# Patient Record
Sex: Male | Born: 1965 | Race: Black or African American | Hispanic: No | Marital: Married | Smoking: Never smoker
Health system: Southern US, Community
[De-identification: ages and names within clinical notes are randomized; demographics above are authoritative.]

## PROBLEM LIST (undated history)

## (undated) DIAGNOSIS — I119 Hypertensive heart disease without heart failure: Secondary | ICD-10-CM

## (undated) DIAGNOSIS — I2510 Atherosclerotic heart disease of native coronary artery without angina pectoris: Secondary | ICD-10-CM

## (undated) DIAGNOSIS — I10 Essential (primary) hypertension: Secondary | ICD-10-CM

## (undated) DIAGNOSIS — E119 Type 2 diabetes mellitus without complications: Secondary | ICD-10-CM

## (undated) DIAGNOSIS — N182 Chronic kidney disease, stage 2 (mild): Secondary | ICD-10-CM

## (undated) DIAGNOSIS — I252 Old myocardial infarction: Secondary | ICD-10-CM

## (undated) DIAGNOSIS — G4730 Sleep apnea, unspecified: Secondary | ICD-10-CM

## (undated) DIAGNOSIS — F419 Anxiety disorder, unspecified: Secondary | ICD-10-CM

## (undated) HISTORY — PX: OTHER SURGICAL HISTORY: SHX169

---

## 2007-08-23 ENCOUNTER — Ambulatory Visit (HOSPITAL_BASED_OUTPATIENT_CLINIC_OR_DEPARTMENT_OTHER): Admission: RE | Admit: 2007-08-23 | Discharge: 2007-08-23 | Payer: Self-pay | Admitting: Internal Medicine

## 2007-08-31 ENCOUNTER — Ambulatory Visit: Payer: Self-pay | Admitting: Internal Medicine

## 2007-10-04 ENCOUNTER — Ambulatory Visit (HOSPITAL_BASED_OUTPATIENT_CLINIC_OR_DEPARTMENT_OTHER): Admission: RE | Admit: 2007-10-04 | Discharge: 2007-10-04 | Payer: Self-pay | Admitting: Internal Medicine

## 2007-10-11 ENCOUNTER — Ambulatory Visit: Payer: Self-pay | Admitting: Internal Medicine

## 2008-08-11 ENCOUNTER — Ambulatory Visit: Payer: Self-pay | Admitting: Family Medicine

## 2008-08-11 ENCOUNTER — Inpatient Hospital Stay (HOSPITAL_COMMUNITY): Admission: EM | Admit: 2008-08-11 | Discharge: 2008-08-12 | Payer: Self-pay | Admitting: Emergency Medicine

## 2008-12-16 ENCOUNTER — Emergency Department (HOSPITAL_COMMUNITY): Admission: EM | Admit: 2008-12-16 | Discharge: 2008-12-16 | Payer: Self-pay | Admitting: Emergency Medicine

## 2011-01-16 NOTE — Procedures (Signed)
NAME:  MCCABE, GLORIA NO.:  [id number]   MEDICAL RECORD NO.:  [id number]          PATIENT TYPE:  OUT   LOCATION:  SLEEP CENTER                 FACILITY:  [facility]   PHYSICIAN:  Clinton D. Maple Hudson, MD, FCCP, FACPDATE OF BIRTH:  1966-10-23   DATE OF STUDY:  08/23/2007                            NOCTURNAL POLYSOMNOGRAM   REFERRING PHYSICIAN:  Fleet Contras, M.D.   INDICATIONS FOR PROCEDURE:  Insomnia with sleep apnea.   RESULTS:  Epward sleepiness score 16/24, BMI 44.6, weight 293 pounds,  height 68 inches, neck 18 inches.   HOME MEDICATIONS:  Charted and reviewed.   SLEEP ARCHITECTURE:  Total sleep time 170 minutes with sleep efficiency  48%. Stage 1 was 5%; Stage 2, 78%; Stage 3 absent; REM 16.8% of total  sleep time. Sleep latency 14 minutes, REM latency 67 minutes. Awake  after sleep onset, 4 minutes. Arousal index 2.8. Lights were out at 2257  p.m. with sleep onset at around 2315. The patient woke at about 2:15  a.m. and remained awake the rest of the night. He states this was  typical for him and referred to a history of post-traumatic stress  disorder. No bedtime medication was taken, although he has Ambien listed  in his home medications.   RESPIRATORY DATA:  Apnea hypopnea index (AHI) 33.2 obstructive events  per hour, indicating moderate obstructive sleep apnea/hypopnea syndrome.  This included 1 obstructive apnea and 93 hypopnea's. REM AHI was 72.9.  Most events occurred while sleeping supine. Diagnostic NP SG protocol  was ordered.   OXYGEN DATA:  Moderately loud snoring with oxygen desaturation to a  nadir of 73%. Mean oxygen saturation through the study was 91.7% on room  air. 21 minutes were recorded with an oxygen saturation of less than  88%.   CARDIAC DATA:  Normal sinus rhythm.   MOVEMENT/PARASOMNIA:  No significant limb movement disturbance. No  bathroom trips.   IMPRESSION/RECOMMENDATIONS:  1. The patient woke shortly after 2:00 a.m. and  was awake the rest of      the night. He indicated that this was a common pattern for him. It      would certainly contribute to the patient's complaint of daytime      sleepiness and discussion of sleep schedule, sleep comfort, and      sleep hygiene would seem appropriate. Note that he was not recorded      as taking Ambien on this study night, although he has it available      at home. He referred to PTSD.  2. Moderate obstructive sleep apnea/hypopnea syndrome, AHI 33.2 per      hour with most events while sleeping supine. Moderately loud      snoring with oxygen desaturation to a nadir of 73%, recording a      total of 21 minutes with oxygen saturation below 88%.  3. Consider return for CPAP titration, bringing a sleep aid medication      or evaluate for alternative therapies as appropriate.      Clinton D. Maple Hudson, MD, FCCP, FACP  Diplomate, Biomedical engineer of Sleep Medicine  Electronically Signed  CDY/MEDQ  D:  08/31/2007 10:40:53  T:  08/31/2007 21:56:42  Job:  [id number]

## 2011-01-16 NOTE — Discharge Summary (Signed)
Ronald Mcdaniel, HERITAGE NO.:  [id number]   MEDICAL RECORD NO.:  [id number]          PATIENT TYPE:  INP   LOCATION:  3731                         FACILITY:  [facility]   PHYSICIAN:  Leighton Roach McDiarmid, M.D.DATE OF BIRTH:  [birth year]   DATE OF ADMISSION:  08/11/2008  DATE OF DISCHARGE:  08/12/2008                               DISCHARGE SUMMARY   DISCHARGE ATTENDING PHYSICIAN:  Leighton Roach McDiarmid, MD   PRIMARY CARE [name]:  Fleet Contras, M.D., however, the patient wishes  to change his primary care [name] to one at [facility].   DISCHARGE DIAGNOSES:  1. Atypical chest pain.  2. Diabetes mellitus type 2.  3. Hypertension.  4. Congestive heart failure.  5. Obstructive sleep apnea.  6. Low HDL.  7. Depression.  8. Renal failure, acute versus chronic.  9. History of seizure disorder.   DISCHARGE MEDICATIONS:  1. NovoLog 70/30, 40 units q.a.m., 30 units q.p.m.  2. Norvasc 10 mg p.o. daily.  3. Benicar 40/25 mg p.o. daily.  4. Lasix 40 mg p.o. daily.  5. Clonidine 0.2 mg b.i.d.  6. Metformin 500 mg b.i.d.  7. Lexapro 10 mg p.o. daily.   DISCONTINUED MEDICATIONS:  None.   CONSULTATION:  Cardiology.   PROCEDURES:  1. Chest x-ray on August 11, 2008, showed T9 and T11 compression      deformities of indeterminate age, no active cardiopulmonary      disease.  2. EKG on August 12, 2008, with normal sinus rhythm, T-wave      inversion in leads I and aVL, flattened T waves in V5 and V6.   Labs on discharge were as follows:  Hemoglobin A1c 8.3.  Point-of-care cardiac enzymes negative x3 sets.  Total cholesterol 115, triglycerides 182, HDL 14, LDL 65.  Dilantin  level less than 0.25.  UA, specific gravity 1.016, negative for glucose,  bilirubin, ketones, blood, or nitrites, 30 protein, moderate leukocyte  esterase, a few squamous cells, 21-50 white blood cells, 0-2 red blood  cells, many bacteria.  Urine culture pending.  Sodium 133,  potassium  3.3, chloride 106, bicarb 24, BUN 16, creatinine 1.67, glucose 131,  calcium 8.7.  AST 14, ALT 21, protein 6.1, albumin 2.8.  White blood  cells 6.6, hemoglobin 11.8, hematocrit 44.0, and platelets 243.   BRIEF HOSPITAL COURSE:  This is a 45 year old male with multiple medical  issues including type 2 diabetes, history of CHF, hypertension,  questionable history of hyperlipidemia, positive family of CAD,  obstructive sleep apnea, history of seizure disorder.  The patient  presented with 1 episode of substernal chest pain which was relieved by  nitroglycerin.  1. Chest pain:  The patient's chest pain was thought to be GI versus      cardiac origin.  The patient's cardiac enzymes were negative x3.      Point-of-care enzymes negative x1.  The patient was without chest      pain throughout the course of his hospitalization.  Fasting lipid      panel as above with low HDL.  A1c 8.3.  EKG with T-wave findings as      described above.  Cardiology was consulted, and it was advised that      a cardiac catheterization be performed.  The patient left AMA,      against medical advice, prior to cardiac cath being performed.  The      patient was advised on the risks associated with his chest pain,      especially given his cardiac risk factors, however, still left      against medical advice.  The patient was warned regarding red flags      with his chest pain and was told to return to care if chest pain      returned.  The patient was afebrile, vital signs stable on leaving      against medical advice.  2. Question history of congestive heart failure:  Again, the patient      left against medical advice.  A 2-D echo was ordered but not      performed on this patient given the fact that he had left prior to      receiving it.  We will obtain the patient's records from primary      care physician as well as from [facility] where the patient was seen for      cardiology.  The patient will be  continued on his home meds.  The      patient reports paroxysmal nocturnal dyspnea, orthopnea, as well as      dyspnea on exertion.  We will follow this patient in the outpatient      setting.  3. Diabetes mellitus type 2:  The patient was admitted and continued      on his NovoLog home regimen and sliding scale.  The patient's      metformin was held during the course of his hospitalization given      his elevated creatinine as well as in anticipation of possible      cardiac catheterization.  4. Renal failure, question of acute versus chronic:  The patient was      admitted with creatinine of 1.8, trended down to 1.67 on day of      discharge.  This was likely prerenal due to dehydration with emesis      and diarrhea that the patient presented with.  We will continue to      follow this in the outpatient setting.  We will obtain records from      the patient's primary care physician.  5. Diarrhea/emesis:  This was likely due to viral gastroenteritis,      acutely.  The patient was started on Imodium in the inpatient      setting.  The patient was advised that he can continue taking      Imodium in the outpatient setting.  This problem was stable during      the course of hospitalization.  The patient did not have any      episodes of diarrhea or constipation or emesis during his      hospitalization.  6. Hypertension:  The patient was continued on his home dose of      Norvasc and clonidine.  The patient was started on metoprolol 12.5      mg b.i.d. but left prior to obtaining prescriptions.  The patient's      ARB was held during the course of his hospitalization with his      elevated creatinine.  7. Obstructive  sleep apnea:  The patient was continued on CPAP nightly      with respiratory therapy.  The patient will presumably continue on      his CPAP at home.  8. History of seizure disorder:  The patient stated that he was taking      Dilantin at home.  The patient's Dilantin is  subtherapeutic.  The      patient denies any seizure activity recently within the past 2      years.  We will attempt to obtain records from the patient's      primary care physician.  The patient was stable regarding this      problem during the course of his hospitalization.  9. Obesity:  We will follow up on this in the outpatient setting,      likely contributing to the patient's multiple medical issues.  10.Depression:  The patient was continued on his home Lexapro.  This      problem was stable for the patient during the course of his      hospitalization.  We will follow up on this in the outpatient      setting.   DISCHARGE INSTRUCTIONS:  The patient left against medical advice, was  repeatedly warned regarding our concerns that his chest pain could be  related to CAD.  The patient was given instructions to follow up at  [facility] or return to care for any of the red flags  including return of his chest pain, worsening dyspnea on exertion, etc.  We will attempt to obtain records from the patient's primary care  physician when he comes to visit at [facility].   FOLLOWUP APPOINTMENTS:  A followup appointment will be made for this  patient at [facility].  We will call the patient  with this appointment and information in the hope that he will follow  up.  The patient will also need outpatient cardiac followup for workup  of his chest pain.   DISCHARGE CONDITION:  The patient left against medical advice, however,  was in stable condition when he left.      Bobby Rumpf, MD  Electronically Signed      Leighton Roach McDiarmid, M.D.  Electronically Signed    KC/MEDQ  D:  08/12/2008  T:  08/13/2008  Job:  [id number]

## 2011-01-16 NOTE — H&P (Signed)
NAMEWYNNE, JURY NO.:  [id number]   MEDICAL RECORD NO.:  [id number]          PATIENT TYPE:  INP   LOCATION:  3731                         FACILITY:  [facility]   PHYSICIAN:  Leighton Roach McDiarmid, M.D.DATE OF BIRTH:  May 08, 1966   DATE OF ADMISSION:  08/11/2008  DATE OF DISCHARGE:                              HISTORY & PHYSICAL   PRIMARY CARE PHYSICIAN:  Fleet Contras, MD   CHIEF COMPLAINT:  Chest pain.   HISTORY OF PRESENT ILLNESS:  Mr. Koska is a 45 year old African American  male now complaining of chest pain that started this morning and is  described as sharp, pressure, midsternal, nonradiating that was  associated with shortness of breath and nausea as well as diaphoresis.  He says that this pain occurred while he was driving, so he pulled over  at an EMS station and was treated by them.  His chest pain was relieved  with nitroglycerin x2, and upon admission to the emergency department,  he was pain-free.  Mr. Batiz also complains of abdominal pain, diarrhea,  nausea, and vomiting x3 days.  He says that he has had diarrhea more  than 6 times per day that was described as yellow with no blood but some  mucus.  He describes some streaks of dark blood in his vomit.  He does  endorse heartburn during these last few days complaining of epigastric  pain and a sour taste in his mouth.   PAST MEDICAL HISTORY:  1. Diabetes mellitus type 2.  2. Seizure disorder.  3. Hypertension.  4. Depression.  5. Congestive heart failure.  6. Obstructive sleep apnea, for which he uses a CPAP at night.   The patient also describes a time in the past years during which he had  chest pain and was told that he needed a stent in his heart.   PAST SURGICAL HISTORY:  He had a trach status post diabetic coma and a  nuclear stress test done at [facility] in the past.   SOCIAL HISTORY:  He lives with his sister.  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  He  denies smoking,  alcohol consumption, and drugs.   FAMILY HISTORY:  His mother died at the age of 53 of heart disease.  Before she died, she was diabetic and needed dialysis.  His father is  alive, but he does not know him.  He has 2 brothers and 3 sisters, 1  brother with diabetes and 1 sister with diabetes.   MEDICATIONS:  1. NovoLog 70/30 40 units in the morning and 30 units in the p.m.  2. Norvasc 10 mg daily.  3. Benicar 40/25 mg daily.  4. Lasix 40 mg daily.  5. Clonidine 0.2 mg b.i.d.  6. Metformin 500 mg b.i.d.  7. Lexapro 10 mg daily.  8. He also reports taking Dilantin, but does not know the dosage.   ALLERGIES:  No known drug allergies.   REVIEW OF SYSTEMS:  As above, otherwise negative with the exception of  positive 6-pillow orthopnea and PND.   PHYSICAL  EXAMINATION:  VITAL SIGNS:  Temperature 98.4, pulse 92,  respirations 20, BP 133/72, and O2 saturation 100% on room air.  GENERAL:  He is obese, alert and oriented x3 in no apparent distress.  HEENT:  Normocephalic and atraumatic.  Pupils are equally round and  reactive to light.  Extraocular muscles are intact.  Mucous membranes  are moist.  He has poor dentition.  NECK:  Supple.  Positive JVD.  No bruits.  CARDIOVASCULAR:  Regular rate and rhythm.  No murmurs, rubs, or gallops.  LUNGS:  Clear to auscultation bilaterally.  No increased work of  breathing.  ABDOMEN:  Soft, obese, diffusely tender but increased tenderness in the  right upper quadrant.  EXTREMITIES:  No cyanosis or clubbing.  He does have nonpitting edema  bilaterally.  NEUROLOGIC:  Cranial nerves II through XII are intact.  Reflexes are 2+  bilaterally.  MUSCULOSKELETAL:  Strength is normal in all extremities.   LABORATORY DATA:  White blood cell count 6.1, hemoglobin 13.8,  hematocrit 42.0, and platelets 267.  Sodium 143, potassium 3.6, chloride  106, CO2 29, BUN 21, creatinine 1.8, and glucose is 101.  Point-of-care  enzymes myoglobin is 274,  CK-MB 6.0, troponin I is less than 0.05.   ASSESSMENT AND PLAN:  Mr. Portales is a 45 year old morbidly obese  gentleman complaining of chest pain.  1. Chest pain, rule out.  There is a question as to whether this is a      GI etiology.  The patient is receiving gastrointestinal signs and      symptoms.  Given the patient's risk factors of diabetes, coronary      artery disease, hypertension, and hyperlipidemia, this may be an      atypical presentation of an MI.  He will need further cardiac      workup and cardiac enzymes q.6 h. x3.  Chest x-ray now, EKG in the      morning, fasting lipids in the morning, and an A1c.  We will give      him low-dose beta-blocker as well as aspirin 81 mg.  We will also      consult Cardiology in the morning.  2. Congestive heart failure.  We will order a 2-D echo for the morning      and continue Lasix at his home dose.  We will hold his ARBs at this      time.  3. Diarrhea.  The patient is still complaining of diarrhea.  We will      prescribe Imodium.  This diarrhea is acute and does not need      further workup at this time.  4. Diabetes.  We will check an A1c.  Continue on his home dose of      NovoLog 70/30 with a sliding scale.  5. Renal failure.  This is likely acute on chronic with known baseline      of creatinine.  We will check a UA and evaluate for BPH signs and      symptoms.  6. Hypertension.  We will continue his home meds and add metoprolol.  7. Hyperlipidemia.  The patient is not on any meds.  We will do also      some fasting lipid panel in the morning.  8. Obstructive sleep apnea.  We will give him a CPAP tonight per RT to      adjust.  9. Depression.  We will give him his home dose of Lexapro.  10.Seizure disorder.  We will start Dilantin at a low-dose, as we do      not know his home dose.  We will also check a Dilantin level.  11.Fluids, electrolytes, nutrition, and gastrointestinal.  Heart-      healthy diet.  We will check CMP in  the morning.  12.Prophylaxis.  Lovenox and SCDs.  13.Disposition.  We will observe the patient overnight and consult      Cardiology in the morning for further cardiac workup.      Helane Rima, MD  Electronically Signed      Leighton Roach McDiarmid, M.D.  Electronically Signed    EW/MEDQ  D:  08/11/2008  T:  08/12/2008  Job:  [id number]

## 2011-01-19 NOTE — Procedures (Signed)
NAME:  ZALMEN, WRIGHTSMAN NO.:  [id number]   MEDICAL RECORD NO.:  [id number]          PATIENT TYPE:  OUT   LOCATION:  SLEEP CENTER                 FACILITY:  [facility]   PHYSICIAN:  Clinton D. Maple Hudson, MD, FCCP, FACPDATE OF BIRTH:  09[birth year]04/66   DATE OF STUDY:                            NOCTURNAL POLYSOMNOGRAM   REFERRING PHYSICIAN:   REFERRING PHYSICIAN:  Fleet Contras, M.D.   DATE OF STUDY:  10/04/2007   INDICATION FOR STUDY:  Hypersomnia with sleep apnea.   EPWORTH SLEEPINESS SCORE:  16/24, BMI 47.5, weight 350 pounds, height 72  inches, neck 19 inches.   HOME MEDICATIONS:  Charted and reviewed.  A diagnostic study on  08/23/2007 recorded an AHI of 33.2 per hour and was significant for  sustained waking after about 2:00 a.m. which he said was his normal  pattern.   SLEEP ARCHITECTURE:  Total sleep time 338 minutes with sleep efficiency  88.2%.  Stage 1 was 1.8%, Stage 2 was 83%, Stage 3 absent, REM 15.2% of  total sleep time.  Sleep latency 3 minutes, REM latency 2 minutes, awake  after sleep onset 15 minutes, arousal index 9.0.  No bedtime medication  was taken.   RESPIRATORY DATA:  CPAP titration protocol.  CPAP was titrated to 11  CWP, AHI 0 per hour.  He chose a large Mirage Quattro mask with heated  humidifier.   OXYGEN DATA:  Snoring was prevented by CPAP, and oxygen saturation held  at a mean of 94.4%.   CARDIAC DATA:  Normal sinus rhythm.   MOVEMENT/PARASOMNIA:  Periodic limb movement with a total of 40 events  counted and an index of 7.1 per hour.  One bathroom trip.   IMPRESSION/RECOMMENDATION:  1. Successful CPAP titration to 11 CWP, AHI 0 per hour.  He chose a      large Mirage Quattro mask with heated humidifier.  2. Baseline diagnostic NPSG on 08/23/2007 had recorded an AHI of 33.2      per hour.  3. Periodic limb movement during CPAP titration with an index of 7.1      arousals attributed to limb movement per hour.  The score is often  high during the CPAP titration.  If clinical impression after      adjustment to home      CPAP is that this is an important sleep disturbance, then consider      a therapeutic trial of specific therapy such as Requip or Mirapex      if appropriate.      Clinton D. Maple Hudson, MD, [facility], FACP  Diplomate, Biomedical engineer of Sleep Medicine  Electronically Signed     CDY/MEDQ  D:  10/11/2007 19:13:14  T:  10/13/2007 12:07:54  Job:  [id number]

## 2011-02-15 DIAGNOSIS — L03019 Cellulitis of unspecified finger: Secondary | ICD-10-CM | POA: Insufficient documentation

## 2011-02-15 DIAGNOSIS — Z79899 Other long term (current) drug therapy: Secondary | ICD-10-CM | POA: Insufficient documentation

## 2011-02-15 DIAGNOSIS — E7800 Pure hypercholesterolemia, unspecified: Secondary | ICD-10-CM | POA: Insufficient documentation

## 2011-02-15 DIAGNOSIS — M79609 Pain in unspecified limb: Secondary | ICD-10-CM | POA: Insufficient documentation

## 2011-02-15 DIAGNOSIS — Z794 Long term (current) use of insulin: Secondary | ICD-10-CM | POA: Insufficient documentation

## 2011-02-15 DIAGNOSIS — I10 Essential (primary) hypertension: Secondary | ICD-10-CM | POA: Insufficient documentation

## 2011-02-15 DIAGNOSIS — E119 Type 2 diabetes mellitus without complications: Secondary | ICD-10-CM | POA: Insufficient documentation

## 2011-02-16 ENCOUNTER — Emergency Department (HOSPITAL_COMMUNITY)
Admission: EM | Admit: 2011-02-16 | Discharge: 2011-02-16 | Disposition: A | Discharge status: Home / Self Care | Payer: BC Managed Care – PPO | Attending: Emergency Medicine | Admitting: Emergency Medicine

## 2011-06-08 LAB — CBC
Hemoglobin: 11.8 g/dL — ABNORMAL LOW (ref 13.0–17.0)
MCHC: 32.9 g/dL (ref 30.0–36.0)
MCHC: 32.9 g/dL (ref 30.0–36.0)
MCV: 81.9 fL (ref 78.0–100.0)
MCV: 82 fL (ref 78.0–100.0)
Platelets: 267 10*3/uL (ref 150–400)
RBC: 4.39 MIL/uL (ref 4.22–5.81)
RBC: 5.12 MIL/uL (ref 4.22–5.81)
RDW: 17.9 % — ABNORMAL HIGH (ref 11.5–15.5)

## 2011-06-08 LAB — CARDIAC PANEL(CRET KIN+CKTOT+MB+TROPI)
CK, MB: 3.1 ng/mL (ref 0.3–4.0)
CK, MB: 3.2 ng/mL (ref 0.3–4.0)
Total CK: 240 U/L — ABNORMAL HIGH (ref 7–232)
Total CK: 263 U/L — ABNORMAL HIGH (ref 7–232)
Troponin I: 0.02 ng/mL (ref 0.00–0.06)

## 2011-06-08 LAB — POCT I-STAT, CHEM 8
Chloride: 106 mEq/L (ref 96–112)
HCT: 44 % (ref 39.0–52.0)
Hemoglobin: 15 g/dL (ref 13.0–17.0)
Potassium: 3.6 mEq/L (ref 3.5–5.1)
Sodium: 143 mEq/L (ref 135–145)

## 2011-06-08 LAB — COMPREHENSIVE METABOLIC PANEL
ALT: 21 U/L (ref 0–53)
Alkaline Phosphatase: 57 U/L (ref 39–117)
BUN: 16 mg/dL (ref 6–23)
CO2: 24 mEq/L (ref 19–32)
Calcium: 8.7 mg/dL (ref 8.4–10.5)
GFR calc non Af Amer: 46 mL/min — ABNORMAL LOW (ref >60)
Glucose, Bld: 131 mg/dL — ABNORMAL HIGH (ref 70–99)
Sodium: 133 mEq/L — ABNORMAL LOW (ref 135–145)

## 2011-06-08 LAB — DIFFERENTIAL
Basophils Absolute: 0 10*3/uL (ref 0.0–0.1)
Basophils Relative: 0 % (ref 0–1)
Eosinophils Absolute: 0.2 10*3/uL (ref 0.0–0.7)
Monocytes Relative: 13 % — ABNORMAL HIGH (ref 3–12)
Neutro Abs: 3.4 10*3/uL (ref 1.7–7.7)
Neutrophils Relative %: 56 % (ref 43–77)

## 2011-06-08 LAB — POCT CARDIAC MARKERS
CKMB, poc: 6 ng/mL (ref 1.0–8.0)
Myoglobin, poc: 274 ng/mL (ref 12–200)
Troponin i, poc: <0.05 ng/mL (ref 0.00–0.09)

## 2011-06-08 LAB — TROPONIN I: Troponin I: 0.03 ng/mL (ref 0.00–0.06)

## 2011-06-08 LAB — URINE MICROSCOPIC-ADD ON

## 2011-06-08 LAB — GLUCOSE, CAPILLARY
Glucose-Capillary: 109 mg/dL — ABNORMAL HIGH (ref 70–99)
Glucose-Capillary: 124 mg/dL — ABNORMAL HIGH (ref 70–99)

## 2011-06-08 LAB — PHENYTOIN LEVEL, TOTAL: Phenytoin Lvl: <2.5 ug/mL — ABNORMAL LOW (ref 10.0–20.0)

## 2011-06-08 LAB — URINALYSIS, ROUTINE W REFLEX MICROSCOPIC
Bilirubin Urine: NEGATIVE
Glucose, UA: NEGATIVE mg/dL
Ketones, ur: NEGATIVE mg/dL
Protein, ur: 30 mg/dL — AB
pH: 5.5 (ref 5.0–8.0)

## 2011-06-08 LAB — CK TOTAL AND CKMB (NOT AT ARMC): Relative Index: 1.4 (ref 0.0–2.5)

## 2011-06-08 LAB — LIPID PANEL
Cholesterol: 115 mg/dL (ref 0–200)
HDL: 14 mg/dL — ABNORMAL LOW (ref >39)
LDL Cholesterol: 65 mg/dL (ref 0–99)

## 2012-05-04 ENCOUNTER — Emergency Department (INDEPENDENT_AMBULATORY_CARE_PROVIDER_SITE_OTHER)
Admission: EM | Admit: 2012-05-04 | Discharge: 2012-05-04 | Disposition: A | Discharge status: Home / Self Care | Payer: BC Managed Care – PPO | Source: Home / Self Care | Attending: Family Medicine | Admitting: Family Medicine

## 2012-05-04 ENCOUNTER — Encounter (HOSPITAL_COMMUNITY): Payer: Self-pay | Admitting: *Deleted

## 2012-05-04 ENCOUNTER — Encounter (HOSPITAL_COMMUNITY): Payer: Self-pay

## 2012-05-04 ENCOUNTER — Inpatient Hospital Stay (HOSPITAL_COMMUNITY)
Admission: EM | Admit: 2012-05-04 | Discharge: 2012-05-06 | DRG: 294 | Disposition: A | Discharge status: Home / Self Care | Payer: BC Managed Care – PPO | Attending: Internal Medicine | Admitting: Internal Medicine

## 2012-05-04 DIAGNOSIS — R110 Nausea: Secondary | ICD-10-CM

## 2012-05-04 DIAGNOSIS — R739 Hyperglycemia, unspecified: Secondary | ICD-10-CM

## 2012-05-04 DIAGNOSIS — F411 Generalized anxiety disorder: Secondary | ICD-10-CM | POA: Diagnosis present

## 2012-05-04 DIAGNOSIS — Z6841 Body Mass Index (BMI) 40.0 and over, adult: Secondary | ICD-10-CM

## 2012-05-04 DIAGNOSIS — R7309 Other abnormal glucose: Secondary | ICD-10-CM

## 2012-05-04 DIAGNOSIS — G40909 Epilepsy, unspecified, not intractable, without status epilepticus: Secondary | ICD-10-CM | POA: Diagnosis present

## 2012-05-04 DIAGNOSIS — N179 Acute kidney failure, unspecified: Secondary | ICD-10-CM | POA: Diagnosis present

## 2012-05-04 DIAGNOSIS — E876 Hypokalemia: Secondary | ICD-10-CM | POA: Diagnosis present

## 2012-05-04 DIAGNOSIS — I10 Essential (primary) hypertension: Secondary | ICD-10-CM

## 2012-05-04 DIAGNOSIS — E669 Obesity, unspecified: Secondary | ICD-10-CM | POA: Diagnosis present

## 2012-05-04 DIAGNOSIS — N183 Chronic kidney disease, stage 3 unspecified: Secondary | ICD-10-CM | POA: Diagnosis present

## 2012-05-04 DIAGNOSIS — E1165 Type 2 diabetes mellitus with hyperglycemia: Secondary | ICD-10-CM

## 2012-05-04 DIAGNOSIS — R42 Dizziness and giddiness: Secondary | ICD-10-CM

## 2012-05-04 DIAGNOSIS — E871 Hypo-osmolality and hyponatremia: Secondary | ICD-10-CM | POA: Diagnosis present

## 2012-05-04 DIAGNOSIS — E870 Hyperosmolality and hypernatremia: Secondary | ICD-10-CM | POA: Diagnosis present

## 2012-05-04 DIAGNOSIS — Z794 Long term (current) use of insulin: Secondary | ICD-10-CM

## 2012-05-04 DIAGNOSIS — E1100 Type 2 diabetes mellitus with hyperosmolarity without nonketotic hyperglycemic-hyperosmolar coma (NKHHC): Principal | ICD-10-CM | POA: Diagnosis present

## 2012-05-04 DIAGNOSIS — I129 Hypertensive chronic kidney disease with stage 1 through stage 4 chronic kidney disease, or unspecified chronic kidney disease: Secondary | ICD-10-CM | POA: Diagnosis present

## 2012-05-04 HISTORY — DX: Anxiety disorder, unspecified: F41.9

## 2012-05-04 LAB — CBC WITH DIFFERENTIAL/PLATELET
Basophils Relative: 1 % (ref 0–1)
HCT: 46.6 % (ref 39.0–52.0)
Hemoglobin: 16.1 g/dL (ref 13.0–17.0)
Lymphs Abs: 3.1 10*3/uL (ref 0.7–4.0)
MCH: 27.8 pg (ref 26.0–34.0)
MCHC: 34.5 g/dL (ref 30.0–36.0)
Monocytes Absolute: 0.5 10*3/uL (ref 0.1–1.0)
Monocytes Relative: 6 % (ref 3–12)
Neutro Abs: 3.8 10*3/uL (ref 1.7–7.7)
Neutrophils Relative %: 49 % (ref 43–77)
RBC: 5.8 MIL/uL (ref 4.22–5.81)

## 2012-05-04 LAB — URINALYSIS, ROUTINE W REFLEX MICROSCOPIC
Ketones, ur: NEGATIVE mg/dL
Leukocytes, UA: NEGATIVE
Nitrite: NEGATIVE
Protein, ur: NEGATIVE mg/dL
Urobilinogen, UA: 0.2 mg/dL (ref 0.0–1.0)

## 2012-05-04 LAB — POCT I-STAT, CHEM 8
BUN: 36 mg/dL — ABNORMAL HIGH (ref 6–23)
Calcium, Ion: 1.13 mmol/L (ref 1.12–1.23)
Chloride: 87 mEq/L — ABNORMAL LOW (ref 96–112)
Creatinine, Ser: 2 mg/dL — ABNORMAL HIGH (ref 0.50–1.35)
TCO2: 30 mmol/L (ref 0–100)

## 2012-05-04 LAB — POCT I-STAT 3, VENOUS BLOOD GAS (G3P V)
TCO2: 37 mmol/L (ref 0–100)
pCO2, Ven: 58.5 mmHg — ABNORMAL HIGH (ref 45.0–50.0)
pH, Ven: 7.394 — ABNORMAL HIGH (ref 7.250–7.300)

## 2012-05-04 LAB — COMPREHENSIVE METABOLIC PANEL
Alkaline Phosphatase: 124 U/L — ABNORMAL HIGH (ref 39–117)
BUN: 33 mg/dL — ABNORMAL HIGH (ref 6–23)
Chloride: 81 mEq/L — ABNORMAL LOW (ref 96–112)
Creatinine, Ser: 2.1 mg/dL — ABNORMAL HIGH (ref 0.50–1.35)
GFR calc Af Amer: 42 mL/min — ABNORMAL LOW (ref >90)
GFR calc non Af Amer: 36 mL/min — ABNORMAL LOW (ref >90)
Glucose, Bld: 740 mg/dL (ref 70–99)
Potassium: 3.4 mEq/L — ABNORMAL LOW (ref 3.5–5.1)
Total Bilirubin: 0.3 mg/dL (ref 0.3–1.2)

## 2012-05-04 LAB — POCT URINALYSIS DIP (DEVICE)
Bilirubin Urine: NEGATIVE
Ketones, ur: NEGATIVE mg/dL
Protein, ur: 30 mg/dL — AB
Specific Gravity, Urine: <=1.005 (ref 1.005–1.030)
pH: 5.5 (ref 5.0–8.0)

## 2012-05-04 LAB — GLUCOSE, CAPILLARY
Glucose-Capillary: >600 mg/dL (ref 70–99)
Glucose-Capillary: 522 mg/dL — ABNORMAL HIGH (ref 70–99)

## 2012-05-04 LAB — URINE MICROSCOPIC-ADD ON

## 2012-05-04 MED ORDER — SODIUM CHLORIDE 0.9 % IV SOLN
INTRAVENOUS | Status: DC
  Administered 2012-05-04 – 2012-05-05 (×2): via INTRAVENOUS
  Administered 2012-05-05: 50 mL/h via INTRAVENOUS

## 2012-05-04 MED ORDER — BUPROPION HCL ER (SMOKING DET) 150 MG PO TB12
150.0000 mg | ORAL_TABLET | Freq: Two times a day (BID) | ORAL | Status: DC
  Administered 2012-05-04 – 2012-05-06 (×4): 150 mg via ORAL
  Filled 2012-05-04 (×5): qty 1

## 2012-05-04 MED ORDER — SODIUM CHLORIDE 0.9 % IV SOLN
INTRAVENOUS | Status: DC
  Filled 2012-05-04: qty 1

## 2012-05-04 MED ORDER — SODIUM CHLORIDE 0.9 % IV BOLUS (SEPSIS)
1000.0000 mL | Freq: Once | INTRAVENOUS | Status: AC
  Administered 2012-05-04: 1000 mL via INTRAVENOUS

## 2012-05-04 MED ORDER — INSULIN ASPART 100 UNIT/ML ~~LOC~~ SOLN
10.0000 [IU] | Freq: Once | SUBCUTANEOUS | Status: AC
  Administered 2012-05-04: 10 [IU] via SUBCUTANEOUS
  Filled 2012-05-04: qty 1

## 2012-05-04 MED ORDER — SODIUM CHLORIDE 0.9 % IV SOLN
INTRAVENOUS | Status: DC
  Administered 2012-05-04: 19:00:00 via INTRAVENOUS

## 2012-05-04 MED ORDER — SODIUM CHLORIDE 0.9 % IV SOLN: INTRAVENOUS | Status: DC

## 2012-05-04 MED ORDER — AMLODIPINE BESYLATE 10 MG PO TABS
10.0000 mg | ORAL_TABLET | Freq: Every day | ORAL | Status: DC
  Administered 2012-05-05 – 2012-05-06 (×2): 10 mg via ORAL
  Filled 2012-05-04 (×2): qty 1

## 2012-05-04 MED ORDER — ACETAMINOPHEN 325 MG PO TABS: 650.0000 mg | ORAL_TABLET | Freq: Four times a day (QID) | ORAL | Status: DC | PRN

## 2012-05-04 MED ORDER — SODIUM CHLORIDE 0.9 % IV BOLUS (SEPSIS)
1000.0000 mL | Freq: Once | INTRAVENOUS | Status: DC
Start: 2012-05-04 — End: 2012-05-04

## 2012-05-04 MED ORDER — DEXTROSE-NACL 5-0.45 % IV SOLN: INTRAVENOUS | Status: DC

## 2012-05-04 MED ORDER — ONDANSETRON HCL 4 MG PO TABS: 4.0000 mg | ORAL_TABLET | Freq: Four times a day (QID) | ORAL | Status: DC | PRN

## 2012-05-04 MED ORDER — INSULIN REGULAR BOLUS VIA INFUSION
0.0000 [IU] | Freq: Three times a day (TID) | INTRAVENOUS | Status: DC
  Filled 2012-05-04: qty 10

## 2012-05-04 MED ORDER — ONDANSETRON HCL 4 MG/2ML IJ SOLN: 4.0000 mg | Freq: Four times a day (QID) | INTRAMUSCULAR | Status: DC | PRN

## 2012-05-04 MED ORDER — PANTOPRAZOLE SODIUM 40 MG PO TBEC
40.0000 mg | DELAYED_RELEASE_TABLET | Freq: Every day | ORAL | Status: DC
  Administered 2012-05-05 – 2012-05-06 (×2): 40 mg via ORAL
  Filled 2012-05-04 (×2): qty 1

## 2012-05-04 MED ORDER — DEXTROSE 50 % IV SOLN: 25.0000 mL | INTRAVENOUS | Status: DC | PRN

## 2012-05-04 MED ORDER — ACETAMINOPHEN 650 MG RE SUPP: 650.0000 mg | Freq: Four times a day (QID) | RECTAL | Status: DC | PRN

## 2012-05-04 MED ORDER — CLONAZEPAM 1 MG PO TABS
2.0000 mg | ORAL_TABLET | Freq: Two times a day (BID) | ORAL | Status: DC | PRN
Start: 2012-05-04 — End: 2012-05-06

## 2012-05-04 MED ORDER — ENOXAPARIN SODIUM 30 MG/0.3ML ~~LOC~~ SOLN
30.0000 mg | SUBCUTANEOUS | Status: DC
  Administered 2012-05-05 – 2012-05-06 (×2): 30 mg via SUBCUTANEOUS
  Filled 2012-05-04 (×2): qty 0.3

## 2012-05-04 NOTE — ED Notes (Signed)
3rd liter hanging, IV therapy at [facility], report called to other RN on 2500/2600 for primary receiving RN, new cbg noted.

## 2012-05-04 NOTE — ED Provider Notes (Signed)
Medical screening examination/treatment/procedure(s) were performed by resident physician or non-physician practitioner and as supervising physician I was immediately available for consultation/collaboration.   Neysa Arts DOUGLAS MD.    Anyelina Claycomb D Kasi Lasky, MD 05/04/12 1715 

## 2012-05-04 NOTE — H&P (Signed)
[facility]          History and Physical    PCP:   Dorrene German, MD   Chief Complaint:  Med refill  HPI: Mr. Ronald Mcdaniel is a 46/M w/ Obesity, HTN, DM, CKD poorly complaint with medications including insulin presents to ER with weakness, blurring of vision, pins and needles like sensation in feet for 4-5 days. He admits missing several insulin doses atleast 4-5 doses over last week and has not taken Po meds for 1 1/2 months and has not checked CBG in over 1 year. He reports running out of his pills several months ago He also reports vomiting and some diarrhea yesterday, vomitus is not bilious and non bloody Denies blood in stools, denies fevers or chills Upon eval in ER noted to have CBG >700 without acidosis and Na of 125, creatinine of 2.1  Allergies:  No Known Allergies    Past Medical History  Diagnosis Date  . Diabetes mellitus   . Hypertension   . Anxiety     History reviewed. No pertinent past surgical history.  Prior to Admission medications   Medication Sig Start Date End Date Taking? Authorizing Provider  amLODipine (NORVASC) 10 MG tablet Take 10 mg by mouth daily.   Yes Historical Provider, MD  buPROPion (ZYBAN) 150 MG 12 hr tablet Take 150 mg by mouth 2 (two) times daily.   Yes Historical Provider, MD  clonazePAM (KLONOPIN) 0.5 MG tablet Take 2 mg by mouth 2 (two) times daily as needed.    Yes Historical Provider, MD  insulin NPH-insulin regular (NOVOLIN 70/30) (70-30) 100 UNIT/ML injection Inject 50 Units into the skin 2 (two) times daily with a meal.   Yes Historical Provider, MD  lisinopril-hydrochlorothiazide (PRINZIDE,ZESTORETIC) 20-25 MG per tablet Take 1 tablet by mouth 2 (two) times daily.   Yes Historical Provider, MD  sitaGLIPtan-metformin (JANUMET) 50-1000 MG per tablet Take 1 tablet by mouth 2 (two) times daily with a meal.   Yes Historical Provider, MD    Social History:  reports that he has never smoked. He does not have any  smokeless tobacco history on file. He reports that he does not drink alcohol or use illicit drugs.  No family history on file.  Review of Systems:  Constitutional: Denies fever, chills, diaphoresis, appetite change and fatigue.  HEENT: Denies photophobia, eye pain, redness, hearing loss, ear pain, congestion, sore throat, rhinorrhea, sneezing, mouth sores, trouble swallowing, neck pain, neck stiffness and tinnitus.   Respiratory: Denies SOB, DOE, cough, chest tightness,  and wheezing.   Cardiovascular: Denies chest pain, palpitations and leg swelling.  Gastrointestinal: Denies nausea, vomiting, abdominal pain, diarrhea, constipation, blood in stool and abdominal distention.  Genitourinary: Denies dysuria, urgency, frequency, hematuria, flank pain and difficulty urinating.  Musculoskeletal: Denies myalgias, back pain, joint swelling, arthralgias and gait problem.  Skin: Denies pallor, rash and wound.  Neurological: Denies dizziness, seizures, syncope, weakness, light-headedness, numbness and headaches.  Hematological: Denies adenopathy. Easy bruising, personal or family bleeding history  Psychiatric/Behavioral: Denies suicidal ideation, mood changes, confusion, nervousness, sleep disturbance and agitation   Physical Exam: Blood pressure 104/72, pulse 90, temperature 98.8 F (37.1 C), temperature source Oral, resp. rate 17, SpO2 98.00%. Gen: obese male not in distress HEENt: PERRLA, EOMI CVS: SIS@/RRR, no m/r/g Lungs: cTAB ABd: obese, soft, NT, BS present Ext: trace edema Neuro: moves all ext, no localising signs SKIn: no rashes or skin breakdown : Labs on Admission:  Results for orders placed during the hospital encounter of  05/04/12 (from the past 48 hour(s))  GLUCOSE, CAPILLARY     Status: Abnormal   Collection Time   05/04/12  3:45 PM      Component Value Range Comment   Glucose-Capillary >600 (*) 70 - 99 mg/dL   CBC WITH DIFFERENTIAL     Status: Normal   Collection Time    05/04/12  3:59 PM      Component Value Range Comment   WBC 7.7  4.0 - 10.5 K/uL    RBC 5.80  4.22 - 5.81 MIL/uL    Hemoglobin 16.1  13.0 - 17.0 g/dL    HCT 98.1  19.1 - 47.8 %    MCV 80.3  78.0 - 100.0 fL    MCH 27.8  26.0 - 34.0 pg    MCHC 34.5  30.0 - 36.0 g/dL    RDW 29.5  62.1 - 30.8 %    Platelets 232  150 - 400 K/uL    Neutrophils Relative 49  43 - 77 %    Neutro Abs 3.8  1.7 - 7.7 K/uL    Lymphocytes Relative 40  12 - 46 %    Lymphs Abs 3.1  0.7 - 4.0 K/uL    Monocytes Relative 6  3 - 12 %    Monocytes Absolute 0.5  0.1 - 1.0 K/uL    Eosinophils Relative 4  0 - 5 %    Eosinophils Absolute 0.3  0.0 - 0.7 K/uL    Basophils Relative 1  0 - 1 %    Basophils Absolute 0.0  0.0 - 0.1 K/uL   COMPREHENSIVE METABOLIC PANEL     Status: Abnormal   Collection Time   05/04/12  3:59 PM      Component Value Range Comment   Sodium 125 (*) 135 - 145 mEq/L    Potassium 3.4 (*) 3.5 - 5.1 mEq/L    Chloride 81 (*) 96 - 112 mEq/L    CO2 32  19 - 32 mEq/L    Glucose, Bld 740 (*) 70 - 99 mg/dL    BUN 33 (*) 6 - 23 mg/dL    Creatinine, Ser 6.57 (*) 0.50 - 1.35 mg/dL    Calcium 9.9  8.4 - 84.6 mg/dL    Total Protein 8.5 (*) 6.0 - 8.3 g/dL    Albumin 3.8  3.5 - 5.2 g/dL    AST 20  0 - 37 U/L    ALT 23  0 - 53 U/L    Alkaline Phosphatase 124 (*) 39 - 117 U/L    Total Bilirubin 0.3  0.3 - 1.2 mg/dL    GFR calc non Af Amer 36 (*) >90 mL/min    GFR calc Af Amer 42 (*) >90 mL/min   URINALYSIS, ROUTINE W REFLEX MICROSCOPIC     Status: Abnormal   Collection Time   05/04/12  5:51 PM      Component Value Range Comment   Color, Urine YELLOW  YELLOW    APPearance CLEAR  CLEAR    Specific Gravity, Urine 1.025  1.005 - 1.030    pH 5.5  5.0 - 8.0    Glucose, UA >1000 (*) NEGATIVE mg/dL    Hgb urine dipstick NEGATIVE  NEGATIVE    Bilirubin Urine NEGATIVE  NEGATIVE    Ketones, ur NEGATIVE  NEGATIVE mg/dL    Protein, ur NEGATIVE  NEGATIVE mg/dL    Urobilinogen, UA 0.2  0.0 - 1.0 mg/dL    Nitrite NEGATIVE   NEGATIVE  Leukocytes, UA NEGATIVE  NEGATIVE   URINE MICROSCOPIC-ADD ON     Status: Normal   Collection Time   05/04/12  5:51 PM      Component Value Range Comment   WBC, UA 0-2  <3 WBC/hpf    RBC / HPF 0-2  <3 RBC/hpf   GLUCOSE, CAPILLARY     Status: Abnormal   Collection Time   05/04/12  6:24 PM      Component Value Range Comment   Glucose-Capillary >600 (*) 70 - 99 mg/dL     Radiological Exams on Admission: No results found.  Assessment/Plan Active Problems: 1. Hyperosmolar syndrome  Diabetes mellitus type 2, uncontrolled Insulin gtt till CBG <400, then resume home insulin 70/30 at higher dose of 60units and stop insulin gtt in 1-2 hours of getting 70/30 Check HbAIC Needs close FU with PCP DM education  2. Vomiting could be secondary to 1, monitor for now  3. Hyponatremia: pseudohyponatremia from hyperglycemia, and dehydration from polyuria, hydrate and monitor  4. CKD (chronic kidney disease), stage III: creatinine 1.6-1.8 from 2009, now 2.0, hydrate, hold ACE and monitor  5. Obesity: lifestyle modification  6. HTN (hypertension): resume norvasc, hold ACE for now   FULL code Dispo: home in 1-2 days  Time Spent on Admission:  [facility] Pager: [phone] 05/04/2012, 7:06 PM

## 2012-05-04 NOTE — ED Provider Notes (Signed)
History     CSN: [id number]  Arrival date & time 05/04/12  1541   First MD Initiated Contact with Patient 05/04/12 1614      Chief Complaint  Patient presents with  . Hyperglycemia    (Consider location/radiation/quality/duration/timing/severity/associated sxs/prior treatment) HPI Comments: Patient is a 46 year old male with a history of diabetes and hypertension presents emergency department with chief complaint of hyperglycemia.  Patient states he's not been taking his medications as directed because he never got a refill from his primary care provider.  Patient is noncompliant with Janumet x 3 weeks, but has been taking insulin Norvasc 50 units in the morning and evening.  Associated symptoms include nocturia, polyuria and a tingling sensation in his lower extremities.  Patient denies any nausea, vomiting, abdominal pain, hematuria, dysuria, numbness of extremities, change in vision or headaches.  Patient has no other complaints at this time.  The history is provided by the patient.    Past Medical History  Diagnosis Date  . Diabetes mellitus   . Hypertension   . Anxiety     History reviewed. No pertinent past surgical history.  No family history on file.  History  Substance Use Topics  . Smoking status: Never Smoker   . Smokeless tobacco: Not on file  . Alcohol Use: No      Review of Systems  Constitutional: Negative for fever, chills and appetite change.  HENT: Negative for congestion.   Eyes: Negative for visual disturbance.  Respiratory: Negative for shortness of breath.   Cardiovascular: Negative for chest pain and leg swelling.  Gastrointestinal: Negative for abdominal pain.  Genitourinary: Positive for frequency. Negative for dysuria and urgency.  Neurological: Negative for dizziness, syncope, weakness, light-headedness, numbness and headaches.  Psychiatric/Behavioral: Negative for confusion.  All other systems reviewed and are negative.    Allergies    Review of patient's allergies indicates no known allergies.  Home Medications   Current Outpatient Rx  Name Route Sig Dispense Refill  . AMLODIPINE BESYLATE 10 MG PO TABS Oral Take 10 mg by mouth daily.    . BUPROPION HCL ER (SMOKING DET) 150 MG PO TB12 Oral Take 150 mg by mouth 2 (two) times daily.    Marland Kitchen CLONAZEPAM 0.5 MG PO TABS Oral Take 2 mg by mouth 2 (two) times daily as needed.     . INSULIN ISOPHANE & REGULAR (70-30) 100 UNIT/ML [place] SUSP Subcutaneous Inject 50 Units into the skin 2 (two) times daily with a meal.    . LISINOPRIL-HYDROCHLOROTHIAZIDE 20-25 MG PO TABS Oral Take 1 tablet by mouth 2 (two) times daily.    Marland Kitchen SITAGLIPTIN-METFORMIN HCL 50-1000 MG PO TABS Oral Take 1 tablet by mouth 2 (two) times daily with a meal.      BP 137/68  Pulse 98  Temp 98 F (36.7 C) (Oral)  Resp 20  SpO2 96%  Physical Exam  Nursing note and vitals reviewed. Constitutional: He is oriented to person, place, and time. He appears well-developed and well-nourished. No distress.       Hypertensive  HENT:  Head: Normocephalic and atraumatic.  Mouth/Throat: Oropharynx is clear and moist. No oropharyngeal exudate.  Eyes: Conjunctivae and EOM are normal. Pupils are equal, round, and reactive to light. No scleral icterus.  Neck: Normal range of motion. Neck supple. No tracheal deviation present. No thyromegaly present.  Cardiovascular: Regular rhythm, normal heart sounds and intact distal pulses.        Slight tachycardia, intact distal pulses  Pulmonary/Chest: Effort normal.  No stridor. No respiratory distress. He has no wheezes.       Lungs clear to auscultation bilaterally  Abdominal: Soft.       Morbidly obese soft abdomen with no tenderness to palpation and normal bowel sounds  Musculoskeletal: Normal range of motion. He exhibits no edema and no tenderness.  Neurological: He is alert and oriented to person, place, and time. Coordination normal.  Skin: Skin is warm and dry. No rash noted. He is  not diaphoretic. No erythema. No pallor.  Psychiatric: He has a normal mood and affect. His behavior is normal.    ED Course  Procedures (including critical care time)  Labs Reviewed  GLUCOSE, CAPILLARY - Abnormal; Notable for the following:    Glucose-Capillary >600 (*)     All other components within normal limits  COMPREHENSIVE METABOLIC PANEL - Abnormal; Notable for the following:    Sodium 125 (*)     Potassium 3.4 (*)     Chloride 81 (*)     Glucose, Bld 740 (*)     BUN 33 (*)     Creatinine, Ser 2.10 (*)     Total Protein 8.5 (*)     Alkaline Phosphatase 124 (*)     GFR calc non Af Amer 36 (*)     GFR calc Af Amer 42 (*)     All other components within normal limits  CBC WITH DIFFERENTIAL  URINALYSIS, ROUTINE W REFLEX MICROSCOPIC   No results found.   No diagnosis found.    MDM  46 year old male presents emergency department with severe hyperglycemia with blood glucose of 740. Patient has been non-compliant with Janumet for 3 weeks, but still taking Norvasc as directed.  Labs reviewed and patient is not acidotic with normal anion gap. Pt does not appear to be in any acute distress.  Pt to be admitted for glucose stabilizer. The patient appears reasonably stabilized for admission considering the current resources, flow, and capabilities available in the ED at this time, and I doubt any other [facility] requiring further screening and/or treatment in the ED prior to admission.          Jaci Carrel, [place] 05/04/12 1824

## 2012-05-04 NOTE — ED Notes (Signed)
IV team paged for 2nd IV site. Lab at [facility] for VBG.

## 2012-05-04 NOTE — ED Notes (Signed)
Pt alert, interactive, calm, skin cool & dry, NAD, speaking in clear complete sentences, resps e/u. Denies pain, sob, nausea or other symptoms at this time. Denies recent fever. Reports nv (last emesis 3d ago), mild dizziness, blurred vision, HA, thirst and frequent urination. 2nd liter infusing w.o. Attempted 2nd IV site x2 w/o success. Drinking water w/o problems.

## 2012-05-04 NOTE — ED Notes (Signed)
PT seen at [facility] with CBG >600

## 2012-05-04 NOTE — ED Notes (Signed)
Pt needs refills on medications and is looking for new PCP.  He also has "pinpricks in feet and itching".

## 2012-05-04 NOTE — ED Notes (Addendum)
Unable to start glucose stabilizer, unable to establish 2nd IV site per IV team x2 attempts, receiving RN made aware. No changes, family at [facility]. MD notified.

## 2012-05-04 NOTE — ED Provider Notes (Signed)
Pt seen with PA He is here for hyperglycemia >700 Will likely need admission for glucose control BP 137/68  Pulse 98  Temp 98 F (36.7 C) (Oral)  Resp 20  SpO2 96%   Joya Gaskins, MD 05/04/12 1735

## 2012-05-04 NOTE — ED Provider Notes (Signed)
History     CSN: [id number]  Arrival date & time 05/04/12  1304   First MD Initiated Contact with Patient 05/04/12 1420      Chief Complaint  Patient presents with  . Medication Refill    (Consider location/radiation/quality/duration/timing/severity/associated sxs/prior treatment) The history is provided by the patient.  Ronald Mcdaniel is a 46 y.o. male who would like a refill of medications.  There is a known history of hypertension and diabetes.  He has been out of medications for 1.5 month.   Primary care provider is unavailable and unable to get an appointment related personal differences.  States he has been attempting to find a new doctor.  He has not able to get an appointment with a primary care provider in this area.     Past Medical History  Diagnosis Date  . Diabetes mellitus   . Hypertension   . Anxiety     History reviewed. No pertinent past surgical history.  History reviewed. No pertinent family history.  History  Substance Use Topics  . Smoking status: Never Smoker   . Smokeless tobacco: Not on file  . Alcohol Use: No      Review of Systems  Constitutional: Negative.   Respiratory: Negative.   Cardiovascular: Positive for leg swelling. Negative for chest pain and palpitations.  Gastrointestinal: Positive for nausea and diarrhea.  Neurological: Positive for dizziness and numbness.    Allergies  Review of patient's allergies indicates no known allergies.  Home Medications   Current Outpatient Rx  Name Route Sig Dispense Refill  . AMLODIPINE BESYLATE 10 MG PO TABS Oral Take 10 mg by mouth daily.    . BUPROPION HCL ER (SMOKING DET) 150 MG PO TB12 Oral Take 150 mg by mouth 2 (two) times daily.    Marland Kitchen CLONAZEPAM 0.5 MG PO TABS Oral Take 0.5 mg by mouth 2 (two) times daily as needed.    . INSULIN ISOPHANE & REGULAR (70-30) 100 UNIT/ML [place] SUSP Subcutaneous Inject 50 Units into the skin 2 (two) times daily with a meal.    . LISINOPRIL-HYDROCHLOROTHIAZIDE  20-25 MG PO TABS Oral Take 1 tablet by mouth 2 (two) times daily.    Marland Kitchen SITAGLIPTIN-METFORMIN HCL 50-1000 MG PO TABS Oral Take 1 tablet by mouth 2 (two) times daily with a meal.      BP 137/96  Pulse 105  Temp 97.6 F (36.4 C) (Oral)  Resp 18  SpO2 95%  Physical Exam  Nursing note and vitals reviewed. Constitutional: He is oriented to person, place, and time. Vital signs are normal. He appears well-developed and well-nourished. He is active and cooperative.  HENT:  Head: Normocephalic.  Mouth/Throat: Oropharynx is clear and moist.  Eyes: Conjunctivae and EOM are normal. Pupils are equal, round, and reactive to light. No scleral icterus.  Neck: Trachea normal. Neck supple. No thyromegaly present.  Cardiovascular: Normal rate and regular rhythm.        Distant heart sounds  Pulmonary/Chest: Effort normal and breath sounds normal. No respiratory distress. He has no wheezes. He has no rales.  Abdominal: Soft. Normal appearance. There is no tenderness. There is no rebound.  Lymphadenopathy:    He has no cervical adenopathy.  Neurological: He is alert and oriented to person, place, and time. He has normal strength. No cranial nerve deficit or sensory deficit. GCS eye subscore is 4. GCS verbal subscore is 5. GCS motor subscore is 6.  Skin: Skin is warm and dry.  Psychiatric: He has a normal  mood and affect. His speech is normal and behavior is normal. Judgment and thought content normal. Cognition and memory are normal.    ED Course  Procedures (including critical care time)  Labs Reviewed  POCT URINALYSIS DIP (DEVICE) - Abnormal; Notable for the following:    Glucose, UA 500 (*)     Protein, ur 30 (*)     All other components within normal limits  POCT I-STAT, CHEM 8 - Abnormal; Notable for the following:    Sodium 128 (*)     Chloride 87 (*)     BUN 36 (*)     Creatinine, Ser 2.00 (*)     Glucose, Bld >700 (*)     Hemoglobin 18.4 (*)     HCT 54.0 (*)     All other components  within normal limits   No results found.   1. Blood glucose elevated   2. Dizziness   3. Nausea       MDM  Transfer to [facility] for further evaluation and treatment of elevated blood glucose, bun and crt.       Johnsie Kindred, NP 05/04/12 1534

## 2012-05-05 ENCOUNTER — Encounter (HOSPITAL_COMMUNITY): Payer: Self-pay | Admitting: *Deleted

## 2012-05-05 DIAGNOSIS — N179 Acute kidney failure, unspecified: Secondary | ICD-10-CM

## 2012-05-05 DIAGNOSIS — E1165 Type 2 diabetes mellitus with hyperglycemia: Secondary | ICD-10-CM

## 2012-05-05 LAB — GLUCOSE, CAPILLARY
Glucose-Capillary: 450 mg/dL — ABNORMAL HIGH (ref 70–99)
Glucose-Capillary: 251 mg/dL — ABNORMAL HIGH (ref 70–99)
Glucose-Capillary: 205 mg/dL — ABNORMAL HIGH (ref 70–99)
Glucose-Capillary: 160 mg/dL — ABNORMAL HIGH (ref 70–99)
Glucose-Capillary: 142 mg/dL — ABNORMAL HIGH (ref 70–99)
Glucose-Capillary: 244 mg/dL — ABNORMAL HIGH (ref 70–99)
Glucose-Capillary: 264 mg/dL — ABNORMAL HIGH (ref 70–99)

## 2012-05-05 LAB — BASIC METABOLIC PANEL
CO2: 31 mEq/L (ref 19–32)
Chloride: 94 mEq/L — ABNORMAL LOW (ref 96–112)
GFR calc Af Amer: 54 mL/min — ABNORMAL LOW (ref >90)
Potassium: 2.7 mEq/L — CL (ref 3.5–5.1)
Sodium: 135 mEq/L (ref 135–145)

## 2012-05-05 LAB — CBC
MCV: 79.5 fL (ref 78.0–100.0)
Platelets: 188 10*3/uL (ref 150–400)
RBC: 5.13 MIL/uL (ref 4.22–5.81)
WBC: 8 10*3/uL (ref 4.0–10.5)

## 2012-05-05 LAB — MRSA PCR SCREENING: MRSA by PCR: NEGATIVE

## 2012-05-05 LAB — HEMOGLOBIN A1C: Hgb A1c MFr Bld: 13.1 % — ABNORMAL HIGH (ref <5.7)

## 2012-05-05 MED ORDER — INSULIN ASPART 100 UNIT/ML ~~LOC~~ SOLN: 0.0000 [IU] | SUBCUTANEOUS | Status: DC

## 2012-05-05 MED ORDER — INSULIN ASPART 100 UNIT/ML ~~LOC~~ SOLN
0.0000 [IU] | Freq: Three times a day (TID) | SUBCUTANEOUS | Status: DC
  Administered 2012-05-05: 8 [IU] via SUBCUTANEOUS
  Administered 2012-05-05: 5 [IU] via SUBCUTANEOUS
  Administered 2012-05-06: 8 [IU] via SUBCUTANEOUS

## 2012-05-05 MED ORDER — POTASSIUM CHLORIDE CRYS ER 20 MEQ PO TBCR
40.0000 meq | EXTENDED_RELEASE_TABLET | Freq: Once | ORAL | Status: AC
  Administered 2012-05-05: 40 meq via ORAL
  Filled 2012-05-05: qty 2

## 2012-05-05 MED ORDER — POTASSIUM CHLORIDE 10 MEQ/100ML IV SOLN
10.0000 meq | INTRAVENOUS | Status: AC
  Administered 2012-05-05: 10 meq via INTRAVENOUS
  Filled 2012-05-05: qty 100

## 2012-05-05 MED ORDER — INSULIN ASPART PROT & ASPART (70-30 MIX) 100 UNIT/ML ~~LOC~~ SUSP
50.0000 [IU] | Freq: Two times a day (BID) | SUBCUTANEOUS | Status: DC
  Administered 2012-05-05 (×2): 50 [IU] via SUBCUTANEOUS
  Filled 2012-05-05: qty 3

## 2012-05-05 MED ORDER — ZOLPIDEM TARTRATE 5 MG PO TABS
5.0000 mg | ORAL_TABLET | Freq: Every evening | ORAL | Status: DC | PRN
  Administered 2012-05-05: 5 mg via ORAL
  Filled 2012-05-05 (×2): qty 1

## 2012-05-05 MED ORDER — INSULIN ASPART PROT & ASPART (70-30 MIX) 100 UNIT/ML ~~LOC~~ SUSP
60.0000 [IU] | Freq: Every day | SUBCUTANEOUS | Status: DC
  Filled 2012-05-05: qty 10

## 2012-05-05 NOTE — Progress Notes (Signed)
Dr Burnadette Peter notified of critical K 2.7 and recent  CBGs    176,160 149 .

## 2012-05-05 NOTE — Progress Notes (Signed)
[facility]             Progress Note   Subjective: Doing better, N/V/D /blurring/dizziness resolved  Objective: Vital signs in last 24 hours: Temp:  [97.6 F (36.4 C)-98.8 F (37.1 C)] 98.5 F (36.9 C) (09/02 0834) Pulse Rate:  [87-110] 87  (09/02 0834) Resp:  [17-27] 20  (09/02 0834) BP: (101-153)/(58-107) 144/94 mmHg (09/02 0834) SpO2:  [95 %-100 %] 96 % (09/02 0834) Weight change:  Last BM Date: 05/04/12  Intake/Output from previous day: 09/01 0701 - 09/02 0700 In: 5155.8 [P.O.:1320; I.V.:3835.8] Out: 600 [Urine:600]     Physical Exam: General: Alert, awake, oriented x3, in no acute distress. HEENT: No bruits, no goiter. Heart: Regular rate and rhythm, without murmurs, rubs, gallops. Lungs: Clear to auscultation bilaterally. Abdomen: Soft, nontender, nondistended, positive bowel sounds. Extremities: No clubbing cyanosis or edema with positive pedal pulses. Neuro: Grossly intact, nonfocal.    Lab Results: Basic Metabolic Panel:  Basename 05/05/12 0500 05/04/12 1559  NA 135 125*  K 2.7* 3.4*  CL 94* 81*  CO2 31 32  GLUCOSE 202* 740*  BUN 26* 33*  CREATININE 1.71* 2.10*  CALCIUM 9.0 9.9  MG -- --  PHOS -- --   Liver Function Tests:  Basename 05/04/12 1559  AST 20  ALT 23  ALKPHOS 124*  BILITOT 0.3  PROT 8.5*  ALBUMIN 3.8   No results found for this basename: LIPASE:2,AMYLASE:2 in the last 72 hours No results found for this basename: AMMONIA:2 in the last 72 hours CBC:  Basename 05/05/12 0500 05/04/12 1559  WBC 8.0 7.7  NEUTROABS -- 3.8  HGB 13.8 16.1  HCT 40.8 46.6  MCV 79.5 80.3  PLT 188 232   Cardiac Enzymes: No results found for this basename: CKTOTAL:3,CKMB:3,CKMBINDEX:3,TROPONINI:3 in the last 72 hours BNP: No results found for this basename: PROBNP:3 in the last 72 hours D-Dimer: No results found for this basename: DDIMER:2 in the last 72 hours CBG:  Basename 05/04/12 2020 05/04/12 1920 05/04/12 1824 05/04/12  1545  GLUCAP 522* 597* >600* >600*   Hemoglobin A1C: No results found for this basename: HGBA1C in the last 72 hours Fasting Lipid Panel: No results found for this basename: CHOL,HDL,LDLCALC,TRIG,CHOLHDL,LDLDIRECT in the last 72 hours Thyroid Function Tests: No results found for this basename: TSH,T4TOTAL,FREET4,T3FREE,THYROIDAB in the last 72 hours Anemia Panel: No results found for this basename: VITAMINB12,FOLATE,FERRITIN,TIBC,IRON,RETICCTPCT in the last 72 hours Coagulation: No results found for this basename: LABPROT:2,INR:2 in the last 72 hours Urine Drug Screen: Drugs of Abuse  No results found for this basename: labopia, cocainscrnur, labbenz, amphetmu, thcu, labbarb    Alcohol Level: No results found for this basename: ETH:2 in the last 72 hours Urinalysis:  Basename 05/04/12 1751 05/04/12 1515  COLORURINE YELLOW --  LABSPEC 1.025 <=1.005  PHURINE 5.5 5.5  GLUCOSEU >1000* 500*  HGBUR NEGATIVE NEGATIVE  BILIRUBINUR NEGATIVE NEGATIVE  KETONESUR NEGATIVE NEGATIVE  PROTEINUR NEGATIVE 30*  UROBILINOGEN 0.2 0.2  NITRITE NEGATIVE NEGATIVE  LEUKOCYTESUR NEGATIVE NEGATIVE    Recent Results (from the past 240 hour(s))  MRSA PCR SCREENING     Status: Normal   Collection Time   05/05/12  1:44 AM      Component Value Range Status Comment   MRSA by PCR NEGATIVE  NEGATIVE Final     Studies/Results: No results found.  Medications: Scheduled Meds:   . amLODipine  10 mg Oral Daily  . buPROPion  150 mg Oral BID  . enoxaparin (LOVENOX) injection  30 mg  Subcutaneous Q24H  . insulin aspart  0-15 Units Subcutaneous TID WC  . insulin aspart  10 Units Subcutaneous Once  . insulin aspart protamine-insulin aspart  50 Units Subcutaneous BID WC  . pantoprazole  40 mg Oral Q1200  . potassium chloride  10 mEq Intravenous Q1 Hr x 4  . potassium chloride  40 mEq Oral Once  . potassium chloride  40 mEq Oral Once  . sodium chloride  1,000 mL Intravenous Once  . DISCONTD: sodium  chloride   Intravenous STAT  . DISCONTD: insulin aspart  0-15 Units Subcutaneous Q4H  . DISCONTD: insulin aspart protamine-insulin aspart  60 Units Subcutaneous Q breakfast  . DISCONTD: insulin regular  0-10 Units Intravenous TID WC  . DISCONTD: sodium chloride  1,000 mL Intravenous Once   Continuous Infusions:   . sodium chloride 100 mL/hr at 05/05/12 0243  . DISCONTD: sodium chloride 150 mL/hr at 05/04/12 1838  . DISCONTD: dextrose 5 % and 0.45% NaCl    . DISCONTD: insulin (NOVOLIN-R) infusion     PRN Meds:.acetaminophen, acetaminophen, clonazePAM, dextrose, ondansetron (ZOFRAN) IV, ondansetron, zolpidem  Assessment/Plan: 1. Hyperosmolar syndrome  Diabetes mellitus type 2, uncontrolled  Poor compliance Transition from insulin gtt to insulin 70/30 today   HbAIC pending Needs close FU with PCP  DM education  2. Vomiting could be secondary to 1, monitor for now  3. Hyponatremia: pseudohyponatremia from hyperglycemia, and dehydration from polyuria, improved  4. CKD (chronic kidney disease), stage III: creatinine 1.6-1.8 from 2009, now 2.0, hydrate, creatinine at baseline  5. Obesity: lifestyle modification  6. Hypokalemia: replace 7. HTN (hypertension): resume norvasc, hold ACE for now   Dispo: home tomorrow if stable    LOS: 1 day   [facility] Pager: [phone] 05/05/2012, 9:01 AM

## 2012-05-05 NOTE — Progress Notes (Signed)
Patient arrived to floor via bed from unit 2500.  A/O, VSS, wife at bedside. Patient oriented to room/unit. Plan of care discussed, IV site WNL, denies discomfort. Lurline Idol [facility]

## 2012-05-06 LAB — GLUCOSE, CAPILLARY

## 2012-05-06 MED ORDER — CLONAZEPAM 2 MG PO TABS: 2.0000 mg | ORAL_TABLET | Freq: Two times a day (BID) | ORAL | Status: DC | PRN

## 2012-05-06 MED ORDER — HYDROCHLOROTHIAZIDE 25 MG PO TABS: 12.5000 mg | ORAL_TABLET | Freq: Every day | ORAL | Status: DC

## 2012-05-06 MED ORDER — INSULIN NPH ISOPHANE & REGULAR (70-30) 100 UNIT/ML ~~LOC~~ SUSP: 60.0000 [IU] | Freq: Two times a day (BID) | SUBCUTANEOUS | Status: DC

## 2012-05-06 MED ORDER — BUPROPION HCL ER (SMOKING DET) 150 MG PO TB12: 150.0000 mg | ORAL_TABLET | Freq: Two times a day (BID) | ORAL | Status: AC

## 2012-05-06 MED ORDER — PANTOPRAZOLE SODIUM 40 MG PO TBEC: 40.0000 mg | DELAYED_RELEASE_TABLET | Freq: Every day | ORAL | Status: DC

## 2012-05-06 MED ORDER — INSULIN ASPART PROT & ASPART (70-30 MIX) 100 UNIT/ML ~~LOC~~ SUSP: 60.0000 [IU] | Freq: Two times a day (BID) | SUBCUTANEOUS | Status: DC

## 2012-05-06 MED ORDER — AMLODIPINE BESYLATE 10 MG PO TABS: 10.0000 mg | ORAL_TABLET | Freq: Every day | ORAL | Status: DC

## 2012-05-06 MED ORDER — INSULIN ASPART PROT & ASPART (70-30 MIX) 100 UNIT/ML ~~LOC~~ SUSP
60.0000 [IU] | Freq: Two times a day (BID) | SUBCUTANEOUS | Status: DC
  Administered 2012-05-06: 60 [IU] via SUBCUTANEOUS
  Filled 2012-05-06: qty 3

## 2012-05-06 NOTE — Care Management Note (Signed)
    Page 1 of 1   05/06/2012     1:51:00 PM   CARE MANAGEMENT NOTE 05/06/2012  Patient:  Ronald Mcdaniel, Ronald Mcdaniel   Account Number:  [id number]  Date Initiated:  05/06/2012  Documentation initiated by:  Letha Cape  Subjective/Objective Assessment:   dx hyperosmolar syndrome  admit- pta independent.     Action/Plan:   Anticipated DC Date:  05/06/2012   Anticipated DC Plan:  HOME/SELF CARE      DC Planning Services  CM consult  PCP issues      Choice offered to / List presented to:             Status of service:  Completed, signed off Medicare Important Message given?   (If response is "NO", the following Medicare IM given date fields will be blank) Date Medicare IM given:   Date Additional Medicare IM given:    Discharge Disposition:  HOME/SELF CARE  Per UR Regulation:  Reviewed for med. necessity/level of care/duration of stay  If discussed at Long Length of Stay Meetings, dates discussed:    Comments:  05/06/12 13:49 Letha Cape RN, BSN [phone] pta independent, patient has medication coverage and transportation.  NCM helped patient find PCP, he has apt scheduled with Dr. Johnn Hai on 9/10 at 10:30 am at [facility].

## 2012-05-06 NOTE — ED Provider Notes (Signed)
Medical screening examination/treatment/procedure(s) were conducted as a shared visit with non-physician practitioner(s) and myself.  I personally evaluated the patient during the encounter   Joya Gaskins, MD 05/06/12 0005

## 2012-05-06 NOTE — Progress Notes (Signed)
Inpatient Diabetes Program Recommendations  AACE/ADA: New Consensus Statement on Inpatient Glycemic Control (2013)  Target Ranges:  Prepandial:   less than 140 mg/dL      Peak postprandial:   less than 180 mg/dL (1-2 hours)      Critically ill patients:  140 - 180 mg/dL   Reason for Visit: A5W=09.8% and admitted with Hyperosmolar syndrome.  Talked to patient regarding home diabetes control.  He states "I didn't think it was that big of a deal".  To follow-up with PCP.  He expressed interest in diabetes doctor.  Gave him the names and numbers of the endocrinologists in [place].  Also sent referral for outpatient diabetes education per protocol to [facility].  He is very interested in attending classes with his wife so they can learn more.    Patient being discharged today.  Discussed importance of monitoring. States he will be purchasing new meter.   No further questions at this time.

## 2012-05-06 NOTE — Progress Notes (Signed)
Patient discharge teaching given, including activity, diet, follow-up appoints, and medications. Patient verbalized understanding of all discharge instructions. IV access was d/c'd. Vitals are stable. Skin is intact except as charted in most recent assessments. Pt to be escorted out by NT, to be driven home by family. 

## 2012-05-28 NOTE — Discharge Summary (Signed)
Physician Discharge Summary  Patient ID: Ronald Mcdaniel MRN: [id number] DOB/AGE: 1966-05-21 46 y.o.  Admit date: 05/04/2012 Discharge date: 05/28/2012  Primary Care Physician:  Dorrene German, MD   Discharge Diagnoses:    Active Problems:  Hyperosmolar syndrome  Diabetes mellitus type 2, uncontrolled  Obesity  CKD (chronic kidney disease), stage III  Hyponatremia  ARF (acute renal failure)  HTN (hypertension)  Seizure disorder      Medication List     As of 05/28/2012  4:47 PM    STOP taking these medications         lisinopril-hydrochlorothiazide 20-25 MG per tablet   Commonly known as: PRINZIDE,ZESTORETIC      sitaGLIPtan-metformin 50-1000 MG per tablet   Commonly known as: JANUMET      TAKE these medications         amLODipine 10 MG tablet   Commonly known as: NORVASC   Take 10 mg by mouth daily.      amLODipine 10 MG tablet   Commonly known as: NORVASC   Take 1 tablet (10 mg total) by mouth daily.      buPROPion 150 MG 12 hr tablet   Commonly known as: ZYBAN   Take 150 mg by mouth 2 (two) times daily.      buPROPion 150 MG 12 hr tablet   Commonly known as: ZYBAN   Take 1 tablet (150 mg total) by mouth 2 (two) times daily.      clonazePAM 0.5 MG tablet   Commonly known as: KLONOPIN   Take 2 mg by mouth 2 (two) times daily as needed.      clonazePAM 2 MG tablet   Commonly known as: KLONOPIN   Take 1 tablet (2 mg total) by mouth 2 (two) times daily as needed (anxiety).      hydrochlorothiazide 25 MG tablet   Commonly known as: HYDRODIURIL   Take 0.5 tablets (12.5 mg total) by mouth daily.      insulin NPH-insulin regular (70-30) 100 UNIT/ML injection   Commonly known as: NOVOLIN 70/30   Inject 60 Units into the skin 2 (two) times daily with a meal.      pantoprazole 40 MG tablet   Commonly known as: PROTONIX   Take 1 tablet (40 mg total) by mouth daily at 12 noon.         Disposition and Follow-up:  PCP in 1 week    Significant Diagnostic  Studies:  No results found.  Brief H and P: [name] is a 46/M w/ Obesity, HTN, DM, CKD poorly complaint with medications including insulin presents to ER with weakness, blurring of vision, pins and needles like sensation in feet for 4-5 days.  He admits missing several insulin doses atleast 4-5 doses over last week and has not taken Po meds for 1 1/2 months and has not checked CBG in over 1 year. He reports running out of his pills several months ago  He also reports vomiting and some diarrhea yesterday, vomitus is not bilious and non bloody  Denies blood in stools, denies fevers or chills  Upon eval in ER noted to have CBG >700 without acidosis and Na of 125, creatinine of 2.1   Hospital Course:  1. Hyperosmolar syndrome  Diabetes mellitus type 2, uncontrolled  Poor compliance  Transition from insulin gtt to insulin 70/30   HbAIC 13.1  Needs close FU with PCP  DM education   2. Vomiting could be secondary to 1, resolved  3. Hyponatremia: pseudohyponatremia  from hyperglycemia, and dehydration from polyuria, improved   4. CKD (chronic kidney disease), stage III: creatinine 1.6-1.8 from 2009, improved to 1.7 at discharge from 2.0  5. Obesity: lifestyle modification   6. Hypokalemia: replace   7. HTN (hypertension): resume norvasc, hold ACE for now    Time spent on Discharge:  SignedZannie Cove [facility] Pager: [phone] 05/28/2012, 4:47 PM

## 2012-06-03 ENCOUNTER — Telehealth: Payer: Self-pay

## 2012-06-03 ENCOUNTER — Ambulatory Visit (INDEPENDENT_AMBULATORY_CARE_PROVIDER_SITE_OTHER): Payer: BC Managed Care – PPO | Admitting: Physician Assistant

## 2012-06-03 VITALS — BP 134/92 | HR 112 | Temp 98.5°F | Resp 16 | Ht 70.0 in | Wt 356.0 lb

## 2012-06-03 DIAGNOSIS — E669 Obesity, unspecified: Secondary | ICD-10-CM

## 2012-06-03 DIAGNOSIS — G4730 Sleep apnea, unspecified: Secondary | ICD-10-CM

## 2012-06-03 DIAGNOSIS — E1140 Type 2 diabetes mellitus with diabetic neuropathy, unspecified: Secondary | ICD-10-CM

## 2012-06-03 DIAGNOSIS — E119 Type 2 diabetes mellitus without complications: Secondary | ICD-10-CM

## 2012-06-03 DIAGNOSIS — E1149 Type 2 diabetes mellitus with other diabetic neurological complication: Secondary | ICD-10-CM

## 2012-06-03 DIAGNOSIS — I10 Essential (primary) hypertension: Secondary | ICD-10-CM

## 2012-06-03 DIAGNOSIS — E1122 Type 2 diabetes mellitus with diabetic chronic kidney disease: Secondary | ICD-10-CM

## 2012-06-03 DIAGNOSIS — G4700 Insomnia, unspecified: Secondary | ICD-10-CM

## 2012-06-03 LAB — COMPREHENSIVE METABOLIC PANEL
ALT: 24 U/L (ref 0–53)
AST: 19 U/L (ref 0–37)
Albumin: 4.4 g/dL (ref 3.5–5.2)
Alkaline Phosphatase: 82 U/L (ref 39–117)
BUN: 22 mg/dL (ref 6–23)
Calcium: 9.6 mg/dL (ref 8.4–10.5)
Chloride: 97 mEq/L (ref 96–112)
Potassium: 3.5 mEq/L (ref 3.5–5.3)
Sodium: 136 mEq/L (ref 135–145)

## 2012-06-03 LAB — LIPID PANEL
Total CHOL/HDL Ratio: 6.9 Ratio
Triglycerides: 364 mg/dL — ABNORMAL HIGH (ref <150)
VLDL: 73 mg/dL — ABNORMAL HIGH (ref 0–40)

## 2012-06-03 LAB — GLUCOSE, POCT (MANUAL RESULT ENTRY): POC Glucose: 318 mg/dl — AB (ref 70–99)

## 2012-06-03 MED ORDER — OLMESARTAN MEDOXOMIL-HCTZ 40-25 MG PO TABS: 1.0000 | ORAL_TABLET | Freq: Every day | ORAL | Status: DC

## 2012-06-03 MED ORDER — GABAPENTIN 100 MG PO CAPS: 100.0000 mg | ORAL_CAPSULE | Freq: Two times a day (BID) | ORAL | Status: DC

## 2012-06-03 MED ORDER — CLONIDINE HCL 0.2 MG PO TABS: 0.2000 mg | ORAL_TABLET | Freq: Two times a day (BID) | ORAL | Status: DC

## 2012-06-03 MED ORDER — AMLODIPINE BESYLATE 10 MG PO TABS: 10.0000 mg | ORAL_TABLET | Freq: Every day | ORAL | Status: DC

## 2012-06-03 MED ORDER — INSULIN NPH ISOPHANE & REGULAR (70-30) 100 UNIT/ML ~~LOC~~ SUSP: 60.0000 [IU] | Freq: Two times a day (BID) | SUBCUTANEOUS | Status: DC

## 2012-06-03 MED ORDER — ESZOPICLONE 2 MG PO TABS: 2.0000 mg | ORAL_TABLET | Freq: Every day | ORAL | Status: DC

## 2012-06-03 MED ORDER — SITAGLIPTIN PHOSPHATE 100 MG PO TABS: 100.0000 mg | ORAL_TABLET | Freq: Every day | ORAL | Status: DC

## 2012-06-03 NOTE — Patient Instructions (Addendum)
Check your sugar 2x/day, in the morning when you wake up and 2h after lunch.  We will be adjusting your insulin dosage every 5 days depending on these readings.  Your lunch time sugar reading will adjust your am dose of insulin and your am sugar reading will adjust your evening insulin dose.  Increase your appropriate insulin dose by 2U every 5 days if your sugar reading is >200.  Try to monitor your BP at home.  Please bring in your blood pressure cuff to your next visit so we can check its accuracy.  For the burning in your feet we have started a medicine called neurontin.  You will start with 1 pill 2/x day and every 5 days you can increase your dose by 1 pill.  You will stop the increase when either your burning stops in your feet or you are taking 3 pills 2x/day.  We are stopping your pantoprazole because your heartburn does not seem to be bothering you currently.  We will be decreasing your bupropion dose to get you off of that medication.  You are currently taking it 2x/day.  For the next week you will only take it 1 pill a day.  After 7 days you will decrease it to a pill every other day for 1 week then you will stop the medication.  We have started you on a new sleep medication - take it nightly and continue to use your CPAP machine.  Schedule an eye exam.  Please bring all your medicines with you to your next visit in 1 month.

## 2012-06-03 NOTE — Telephone Encounter (Signed)
Patient was seen today, rxd lunesta. He went to pharmacy to fill it and they said he needs authorization to fill it. He didn't know if it mean prior-auth or auth to rx a generic version??  [id number] - [place] ([place]), [place] - [address]

## 2012-06-03 NOTE — Progress Notes (Signed)
[address] [place] [address]   Phone [phone]  Subjective:    Patient ID: Ronald Mcdaniel, male    DOB: November 26, 1965, 46 y.o.   MRN: [id number]  HPI Pt presents to clinic for med refills for his DM, insomnia and HTN.  Pt is unsure of his exact medications upon questioning.  He was in the hospital 05/04/2012 for elevated glucose and vision changes.  Pt is unhappy with his current medical care and is looking to seek his care from [place]. 1- HTN - Better control but unsure of his exact medications, he was taken off lisinopril at the hospital and thinks he has been taking benicar but he has been out of this for a while. 2- DM - uncontrolled - sugars have been running in the 200's (good per patient), while in the hospital last month he was put on Novolin 70/30 and has been using is bid.  He does not regularly check his sugars.  He has not been to diabetic nutrition services but thinks it is being scheduled.  No one has ever talked to him about his diet.  3- kidney disease - has not seen a nephrologist - unaware he had any problems 4- peripheral neuropathy - has burning in his feet "like needles" up to his knees - but no numbness 5- insomnia - Ambien is not working he wants something else.  He has trouble falling asleep and staying asleep - he mainly takes cat naps for 10-15 mins at a time 6- GERD - he did not know he was being treated for this but he does not have heartburn regularly - seems to be only related to eating spicy things 7- depression/anxiety - pt no longer takes klonopin because he feels like he has no anxiety, pt does not feel like he is depressed and is not sure why he is on medication for this - 8- Genital herpes - takes medications prn outbreaks - rarely gets outbreaks 9- sleep apnea - he uses CPAP and states it has been titrated recently  Pt is a very poor historian. Last eye exam was last year. We had to call pharmacy for his medication list. Review of Systems  Constitutional:  Negative for fever, chills and fatigue.  Eyes: Negative for visual disturbance.  Respiratory: Negative for chest tightness and shortness of breath.   Cardiovascular: Negative for chest pain.  Neurological: Negative for weakness.       Objective:   Physical Exam  Vitals reviewed. Constitutional: He is oriented to person, place, and time. He appears well-developed and well-nourished.  HENT:  Head: Normocephalic and atraumatic.  Right Ear: Hearing, tympanic membrane, external ear and ear canal normal.  Left Ear: Hearing, tympanic membrane, external ear and ear canal normal.  Nose: Nose normal.  Mouth/Throat: Uvula is midline and oropharynx is clear and moist.  Eyes: Conjunctivae normal and EOM are normal. Pupils are equal, round, and reactive to light.  Neck: Normal range of motion. Neck supple.  Cardiovascular: Normal rate, regular rhythm and normal heart sounds.  Exam reveals no gallop and no friction rub.   No murmur heard. Pulmonary/Chest: Effort normal and breath sounds normal.  Abdominal: Soft.  Musculoskeletal:       Right foot: He exhibits swelling.       Left foot: He exhibits swelling.       Bilateral LE edema - some darkening of skin consistent with peripheral vascular disease - no lesions of feet  Lymphadenopathy:    He has no  cervical adenopathy.  Neurological: He is alert and oriented to person, place, and time. No sensory deficit.       Filament testing normal Bilateral feet/legs.  Skin: Skin is warm and dry.  Psychiatric: He has a normal mood and affect. His behavior is normal. Judgment and thought content normal.   Results for orders placed in visit on 06/03/12  GLUCOSE, POCT (MANUAL RESULT ENTRY)      Component Value Range   POC Glucose 318 (*) 70 - 99 mg/dl   (pt had a big mac for breakfast and has not taken his insulin)  Reviewed pts labs from hospital visit last month.    Assessment & Plan:   1. Diabetes mellitus  POCT glucose (manual entry),  Comprehensive metabolic panel, Lipid panel, sitaGLIPtin (JANUVIA) 100 MG tablet  2. Obesity  TSH  3. HTN (hypertension)  Comprehensive metabolic panel, TSH  4. Insomnia  eszopiclone (LUNESTA) 2 MG TABS, TSH  5. Diabetic neuropathy  gabapentin (NEURONTIN) 100 MG capsule  6. Sleep apnea    7. Seizure disorder     1- d/w pt at length the concern with his diabetes and lack of control.  D/w him that he must take some responsibility in the treatment of his condition - he must monitor his diet and rty for weight loss.  He has made some positive changes at home in regards to his diet but hopefully after his diabetes teaching appointment he can make further changes. d/w pt the importance of being on his medications at his office visits - will check labs in regards to kidney function and if still abnl will send to nephrologist -- hopefully we can get his sugars controlled which will help decrease worsening damage 2- continue healthy changes 3- continue current medications - d/w pt the importance of being on his medications at his office visits. 4- will try lunesta -  5- will start medications - pt will titrate up neurontin - d/w pt better glucose control will improve his symptoms 6- continue CPAP machine 7- seems to be resolved if patient has any more problems will send him to neurologist 8- depression/anxiety - seems to per patient have resolved - will titrate pt off of wellbutrin esp with the increase seizure threshold with his h/o seizure in the past. 9- GERD - seems to be episodic and will stop because pt does not need to be on unnecessary medications  Overall patient seems very uneducated about his disease processes.  I spent about 1h with at least half of that spent educating the patient about his current medical conditions and how to take his medications an changes that could be made in his lifestyle.  Pt seemed to have a better understanding when he left about his medications and how to take them as  well as his disease process.  We will plan on seeing patient back in 1 month.

## 2012-06-04 NOTE — Telephone Encounter (Signed)
Called patient he needs to have pharmacy send information so we can get prior auth. If this is what he needs.

## 2012-06-04 NOTE — Addendum Note (Signed)
Addended by: Morrell Riddle on: 06/04/2012 03:43 PM   Modules accepted: Orders

## 2012-06-05 NOTE — Telephone Encounter (Signed)
Prior [employer] form has been completed and faxed - pending decision from ins co.

## 2012-06-06 NOTE — Telephone Encounter (Signed)
Called to check status of PA and was told it is still pending and we should have answer w/in 24 hrs after receiving form. Since we faxed form orig on 10/2, I re-faxed form today.

## 2012-06-10 NOTE — Telephone Encounter (Signed)
Called [employer] again to check status and representative asked some other clin ?s over the phone and PA was approved. Faxed approval notice to pharmacy.

## 2012-06-23 ENCOUNTER — Ambulatory Visit: Payer: BC Managed Care – PPO | Admitting: Internal Medicine

## 2012-06-23 DIAGNOSIS — Z0289 Encounter for other administrative examinations: Secondary | ICD-10-CM

## 2012-07-02 ENCOUNTER — Other Ambulatory Visit: Payer: Self-pay | Admitting: Physician Assistant

## 2012-07-07 NOTE — Progress Notes (Signed)
Prior auth form had been faxed to ins on 07/03/12. Called to check status and received approval. Faxed notice of approval to pharmacy.

## 2012-08-01 ENCOUNTER — Other Ambulatory Visit: Payer: Self-pay | Admitting: Physician Assistant

## 2012-08-03 NOTE — Telephone Encounter (Signed)
At Tl desk 

## 2012-08-10 ENCOUNTER — Other Ambulatory Visit: Payer: Self-pay

## 2012-08-10 MED ORDER — ACYCLOVIR 800 MG PO TABS: 800.0000 mg | ORAL_TABLET | Freq: Three times a day (TID) | ORAL | Status: DC | PRN

## 2012-08-29 ENCOUNTER — Other Ambulatory Visit: Payer: Self-pay | Admitting: Physician Assistant

## 2012-09-24 ENCOUNTER — Other Ambulatory Visit: Payer: Self-pay | Admitting: Physician Assistant

## 2012-09-24 ENCOUNTER — Other Ambulatory Visit: Payer: Self-pay | Admitting: *Deleted

## 2012-09-24 MED ORDER — SITAGLIPTIN PHOSPHATE 100 MG PO TABS: 100.0000 mg | ORAL_TABLET | Freq: Every day | ORAL | Status: DC

## 2012-09-24 MED ORDER — OLMESARTAN MEDOXOMIL-HCTZ 40-25 MG PO TABS: 1.0000 | ORAL_TABLET | Freq: Every day | ORAL | Status: DC

## 2012-09-24 MED ORDER — CLONIDINE HCL 0.2 MG PO TABS: 0.2000 mg | ORAL_TABLET | Freq: Two times a day (BID) | ORAL | Status: DC

## 2012-09-29 ENCOUNTER — Other Ambulatory Visit: Payer: Self-pay | Admitting: *Deleted

## 2012-09-29 MED ORDER — AMLODIPINE BESYLATE 10 MG PO TABS: 10.0000 mg | ORAL_TABLET | Freq: Every day | ORAL | Status: DC

## 2012-10-23 ENCOUNTER — Other Ambulatory Visit: Payer: Self-pay | Admitting: Physician Assistant

## 2012-10-23 NOTE — Telephone Encounter (Signed)
Pt must have an OV before his sleeping meds are filled.  We need to see him q3 months for his DM.

## 2012-10-24 ENCOUNTER — Ambulatory Visit (INDEPENDENT_AMBULATORY_CARE_PROVIDER_SITE_OTHER): Payer: BC Managed Care – PPO | Admitting: Family Medicine

## 2012-10-24 VITALS — BP 148/112 | HR 110 | Temp 98.5°F | Resp 20 | Ht 73.0 in | Wt 356.6 lb

## 2012-10-24 DIAGNOSIS — E1140 Type 2 diabetes mellitus with diabetic neuropathy, unspecified: Secondary | ICD-10-CM

## 2012-10-24 DIAGNOSIS — I10 Essential (primary) hypertension: Secondary | ICD-10-CM

## 2012-10-24 DIAGNOSIS — E119 Type 2 diabetes mellitus without complications: Secondary | ICD-10-CM

## 2012-10-24 DIAGNOSIS — G479 Sleep disorder, unspecified: Secondary | ICD-10-CM

## 2012-10-24 DIAGNOSIS — G4733 Obstructive sleep apnea (adult) (pediatric): Secondary | ICD-10-CM

## 2012-10-24 LAB — GLUCOSE, POCT (MANUAL RESULT ENTRY): POC Glucose: 336 mg/dl — AB (ref 70–99)

## 2012-10-24 LAB — LIPID PANEL
Cholesterol: 218 mg/dL — ABNORMAL HIGH (ref 0–200)
LDL Cholesterol: 120 mg/dL — ABNORMAL HIGH (ref 0–99)
VLDL: 67 mg/dL — ABNORMAL HIGH (ref 0–40)

## 2012-10-24 LAB — POCT GLYCOSYLATED HEMOGLOBIN (HGB A1C): Hemoglobin A1C: 14

## 2012-10-24 LAB — COMPREHENSIVE METABOLIC PANEL
ALT: 43 U/L (ref 0–53)
CO2: 25 mEq/L (ref 19–32)
Potassium: 3.6 mEq/L (ref 3.5–5.3)
Sodium: 133 mEq/L — ABNORMAL LOW (ref 135–145)
Total Bilirubin: 0.4 mg/dL (ref 0.3–1.2)
Total Protein: 8 g/dL (ref 6.0–8.3)

## 2012-10-24 MED ORDER — AMLODIPINE BESYLATE 10 MG PO TABS: ORAL_TABLET | ORAL | Status: DC

## 2012-10-24 MED ORDER — SITAGLIPTIN PHOSPHATE 100 MG PO TABS: ORAL_TABLET | ORAL | Status: DC

## 2012-10-24 MED ORDER — OLMESARTAN MEDOXOMIL-HCTZ 40-25 MG PO TABS: ORAL_TABLET | ORAL | Status: DC

## 2012-10-24 MED ORDER — ESZOPICLONE 3 MG PO TABS: ORAL_TABLET | ORAL | Status: DC

## 2012-10-24 MED ORDER — GABAPENTIN 100 MG PO CAPS: 100.0000 mg | ORAL_CAPSULE | Freq: Three times a day (TID) | ORAL | Status: DC

## 2012-10-24 MED ORDER — INSULIN NPH ISOPHANE & REGULAR (70-30) 100 UNIT/ML ~~LOC~~ SUSP: SUBCUTANEOUS | Status: DC

## 2012-10-24 MED ORDER — CLONIDINE HCL 0.2 MG PO TABS: ORAL_TABLET | ORAL | Status: DC

## 2012-10-24 NOTE — Patient Instructions (Addendum)
Be  Faithful and working on regular exercise, eating less, and losing weight  Return in 4 months for recheck of diabetes  Take your medication very faithfully.  Following your blood pressures by getting them checked elsewhere. If they're running over 140/90 you should return sooner. It is absolutely important to take pills every day.  Use your CPAP machine

## 2012-10-24 NOTE — Progress Notes (Signed)
Subjective: 47 year old gentleman who is here for a regular visit. He usually sees Benny Lennert, but she  is not here today. He is on disability. He does live in [place] but his wife works up here symptoms appear to see her. He is a nonsmoker. 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 He did not take all his medicines today because the left him down [place]. He says he walks about a mile and a half a day. He said he had lost some weight in the recent months. No chest pain or dyspnea. He does have constipation. He takes a stool softener sometimes. He wanted a vitamin. His biggest concern is that he wants to go up on his sleeping medications. He says he doesn't sleep. He does have a CPAP it is only used about twice in the past year because he says it doesn't help him. He does not drink soft drinks, but it does drink tea 4-5 times a day.  Objective: Obese Afro-American male in no major distress. TMs normal. Throat clear. Neck supple without nodes thyromegaly. No carotid bruits. Chest clear to auscultation. Heart regular without murmurs. And soft without mass or tenderness. No ankle edema.  Assessment: Diabetes mellitus Obesity Sleep disturbance Uncontrolled hypertension Constipation  Plan: Check labs Increase Lunesta 3 mg daily. Discussed proper sleep habits with him.  Work hard on weight loss and rate and increased regular exercise.

## 2012-10-25 ENCOUNTER — Encounter: Payer: Self-pay | Admitting: Family Medicine

## 2012-10-31 ENCOUNTER — Ambulatory Visit (INDEPENDENT_AMBULATORY_CARE_PROVIDER_SITE_OTHER): Payer: BC Managed Care – PPO | Admitting: Physician Assistant

## 2012-10-31 VITALS — BP 132/88 | HR 82 | Temp 98.0°F | Resp 20 | Ht 69.5 in | Wt 356.0 lb

## 2012-10-31 DIAGNOSIS — A6000 Herpesviral infection of urogenital system, unspecified: Secondary | ICD-10-CM

## 2012-10-31 DIAGNOSIS — K5900 Constipation, unspecified: Secondary | ICD-10-CM

## 2012-10-31 DIAGNOSIS — E1065 Type 1 diabetes mellitus with hyperglycemia: Secondary | ICD-10-CM

## 2012-10-31 DIAGNOSIS — J069 Acute upper respiratory infection, unspecified: Secondary | ICD-10-CM

## 2012-10-31 DIAGNOSIS — D2920 Benign neoplasm of unspecified testis: Secondary | ICD-10-CM

## 2012-10-31 DIAGNOSIS — K602 Anal fissure, unspecified: Secondary | ICD-10-CM

## 2012-10-31 MED ORDER — VALACYCLOVIR HCL 1 G PO TABS: ORAL_TABLET | ORAL | Status: DC

## 2012-10-31 MED ORDER — POLYETHYLENE GLYCOL 3350 17 GM/SCOOP PO POWD: 17.0000 g | Freq: Every day | ORAL | Status: DC

## 2012-10-31 MED ORDER — ATORVASTATIN CALCIUM 20 MG PO TABS: 20.0000 mg | ORAL_TABLET | Freq: Every day | ORAL | Status: DC

## 2012-10-31 MED ORDER — DILTIAZEM GEL 2 %: 1.0000 "application " | Freq: Three times a day (TID) | CUTANEOUS | Status: DC

## 2012-10-31 MED ORDER — TRIAMCINOLONE ACETONIDE(NASAL) 55 MCG/ACT NA INHA: 2.0000 | Freq: Every day | NASAL | Status: DC

## 2012-10-31 MED ORDER — BENZONATATE 100 MG PO CAPS: ORAL_CAPSULE | ORAL | Status: DC

## 2012-10-31 MED ORDER — LIDOCAINE (ANORECTAL) 5 % EX GEL: 1.0000 "application " | Freq: Three times a day (TID) | CUTANEOUS | Status: DC

## 2012-10-31 NOTE — Patient Instructions (Signed)
Increase insulin to 65U 2x/day Monitor sugar, in the am before you ewat and two other times during the day 2 hours after you eat

## 2012-10-31 NOTE — Progress Notes (Signed)
[address] [place] [address]   Phone [phone]  Subjective:    Patient ID: Ronald Mcdaniel, male    DOB: [birth year], 47 y.o.   MRN: [id number]  HPI Pt presents to clinic for multiple problems. 1- he has had a cold for a week with sinus congestion and cough with PND - he has been using Mucinex intermittently. 2- he has been constipated recently.  Now for the last several days he has had pain with passing his stool but no blood on the toilet tissue.  He uses stool softner daily to help with constipation. 3- His sugars have been running about 150 in the am.  He uses Humulin 60 U bid.  He has changed his diet to Old Fashion oatmeal in the am, boiled chicken and veggie soup for lunch, a 100 cal sweet treat in the afternoon and baked chicken for dinner.  He will sometimes have a salad and only uses a small amount of drsg.  He gets no exercise. Pt has never been to diabetes education. 4- He would like to see if there is anything else for his genital herpes because he feels like the Acyclovir is not helping like it used to because he is getting more outbreaks.  He is sexually active with his wife and always uses a condom. 5- Upset because his insurance company will only give him #15 Lunesta a month.  He has sleep apnea and has trouble sleeping because of the face mask.  Review of Systems  HENT: Positive for congestion, sore throat, rhinorrhea (yellow), sneezing and sinus pressure.   Respiratory: Positive for cough (yellow sputum). Negative for shortness of breath and wheezing.   Gastrointestinal: Positive for constipation and rectal pain.       Objective:   Physical Exam  Vitals reviewed. Constitutional: He appears well-developed and well-nourished.  HENT:  Head: Normocephalic and atraumatic.  Right Ear: Hearing, tympanic membrane, external ear and ear canal normal.  Left Ear: Hearing, external ear and ear canal normal. Tympanic membrane is retracted (dull appearance).  Nose: Mucosal edema  (red and very swollen) present.  Mouth/Throat: Uvula is midline, oropharynx is clear and moist and mucous membranes are normal.  Eyes: Conjunctivae are normal.  Cardiovascular: Normal rate, regular rhythm and normal heart sounds.   Pulmonary/Chest: Effort normal and breath sounds normal.  Lymphadenopathy:    He has cervical adenopathy (L sided, TTP).       Assessment & Plan:  Genital herpes - We will d/c his acyclovir and start Valtrex.  D/w pt how to take. Plan: valACYclovir (VALTREX) 1000 MG tablet  Pure hypercholesterolemia - Reviewed pateint's medications and labs labs and he is not on a statin.  We will start Lipitor which pt states he was on in the past and tolerated well.  He is unsure why it was stopped.  He will continue to do healthy lifestyle choice.Plan: atorvastatin (LIPITOR) 20 MG tablet  URI, acute - will treat symptoms at this time and then if no better pt will contact me. Plan: triamcinolone (NASACORT AQ) 55 MCG/ACT nasal inhaler, benzonatate (TESSALON) 100 MG capsule  Anal fissure - Will try and get constipation under control and this should hopefully not happen again.  Plan: Lidocaine, Anorectal, 5 % GEL, diltiazem 2 % GEL  Unspecified constipation - Plan: polyethylene glycol powder (GLYCOLAX/MIRALAX) powder  Uncontrolled type 1 DM with stage 1 chronic kidney disease - I find it hard to believe that patient is adhering to the above diet due to  no weight loss and a Hgb A1C of 14.  He has never been to DM education classes so we will set him up with that, though patient prefers to go to group classes vs individual.  We will also increase his Humalin to 65 U bid and pt will monitor his glucose, fasting in the am and then 2 h after 2 of his meals to look at his control.  He is also to keep a food diary along with these readings and then drop them off to me in 2 weeks - Plan: Ambulatory referral to diabetic education  Insomnia - Pt will contact insurance company to see if there  is paperwork that he or I can fill out for them to cover his Lunesta for daily use due to his chronic insomnia and CPAP use.

## 2012-11-01 ENCOUNTER — Telehealth: Payer: Self-pay

## 2012-11-01 NOTE — Telephone Encounter (Signed)
PATIENT STATES HE WAS HERE Friday AND SAW [employer]. SHE GAVE HIM LIDOCAINE FOR A  ABCESS ON HIS BUTTOCKS. WHEN WILL HE START TO SEE SOME RESULTS? BEST PHONE [phone] (HOME)   PHARMACY CHOICE IS [facility] [address].  MBC

## 2012-11-03 ENCOUNTER — Emergency Department (HOSPITAL_COMMUNITY)
Admission: EM | Admit: 2012-11-03 | Discharge: 2012-11-03 | Disposition: A | Discharge status: Home / Self Care | Payer: BC Managed Care – PPO | Attending: Emergency Medicine | Admitting: Emergency Medicine

## 2012-11-03 DIAGNOSIS — L0291 Cutaneous abscess, unspecified: Secondary | ICD-10-CM

## 2012-11-03 DIAGNOSIS — I10 Essential (primary) hypertension: Secondary | ICD-10-CM | POA: Insufficient documentation

## 2012-11-03 DIAGNOSIS — K5900 Constipation, unspecified: Secondary | ICD-10-CM | POA: Insufficient documentation

## 2012-11-03 DIAGNOSIS — Z794 Long term (current) use of insulin: Secondary | ICD-10-CM | POA: Insufficient documentation

## 2012-11-03 DIAGNOSIS — F411 Generalized anxiety disorder: Secondary | ICD-10-CM | POA: Insufficient documentation

## 2012-11-03 DIAGNOSIS — L0231 Cutaneous abscess of buttock: Secondary | ICD-10-CM | POA: Insufficient documentation

## 2012-11-03 DIAGNOSIS — K6289 Other specified diseases of anus and rectum: Secondary | ICD-10-CM | POA: Insufficient documentation

## 2012-11-03 DIAGNOSIS — E119 Type 2 diabetes mellitus without complications: Secondary | ICD-10-CM | POA: Insufficient documentation

## 2012-11-03 DIAGNOSIS — Z79899 Other long term (current) drug therapy: Secondary | ICD-10-CM | POA: Insufficient documentation

## 2012-11-03 MED ORDER — SULFAMETHOXAZOLE-TRIMETHOPRIM 800-160 MG PO TABS: 1.0000 | ORAL_TABLET | Freq: Two times a day (BID) | ORAL | Status: DC

## 2012-11-03 NOTE — ED Provider Notes (Signed)
History     CSN: [id number]  Arrival date & time 11/03/12  1242   First MD Initiated Contact with Patient 11/03/12 1338      Chief Complaint  Patient presents with  . Abscess    (Consider location/radiation/quality/duration/timing/severity/associated sxs/prior treatment) HPI Comments: Pt presents to the ED for a buttock abscess x 2 weeks.  Pt reports that this was evaluated by his PCP and was given cream to numb the area but sx are not improving.  No fluid has been draining from the abscess.  Pt reports that he has constipation issues and takes a daily stool softener to help with BMs.  Has been taking OTC aleve for the discomfort with good relief.  Denies any fever or bloody stools.  Patient is a 47 y.o. male presenting with abscess. The history is provided by the patient.  Abscess   Past Medical History  Diagnosis Date  . Diabetes mellitus   . Hypertension   . Anxiety     Past Surgical History  Procedure Laterality Date  . Tracheostomy in 2006      Family History  Problem Relation Age of Onset  . Diabetes Sister   . Thyroid disease Sister   . Diabetes Brother     History  Substance Use Topics  . Smoking status: Never Smoker   . Smokeless tobacco: Not on file  . Alcohol Use: No      Review of Systems  Gastrointestinal: Positive for rectal pain.  All other systems reviewed and are negative.    Allergies  Review of patient's allergies indicates no known allergies.  Home Medications   Current Outpatient Rx  Name  Route  Sig  Dispense  Refill  . amLODipine (NORVASC) 10 MG tablet      Take one daily for blood pressure   30 tablet   4   . atorvastatin (LIPITOR) 20 MG tablet   Oral   Take 1 tablet (20 mg total) by mouth daily.   30 tablet   2   . benzonatate (TESSALON) 100 MG capsule      1-2 po tid prn cough   40 capsule   0   . cloNIDine (CATAPRES) 0.2 MG tablet      Take one twice daily for blood pressure   60 tablet   4   . Eszopiclone  (LUNESTA) 3 MG TABS      Take one pill immediately before bedtime   30 tablet   4     Needs office visit   . gabapentin (NEURONTIN) 100 MG capsule   Oral   Take 1 capsule (100 mg total) by mouth 3 (three) times daily.   90 capsule   4   . insulin NPH-insulin regular (NOVOLIN 70/30) (70-30) 100 UNIT/ML injection   Subcutaneous   Inject 65 Units into the skin 2 (two) times daily. Use 60 units twice daily         . Lidocaine, Anorectal, 5 % GEL   Apply externally   Apply 1 application topically 3 (three) times daily.   30 g   0   . olmesartan-hydrochlorothiazide (BENICAR HCT) 40-25 MG per tablet      Take one daily for blood pressure   30 tablet   4   . polyethylene glycol powder (GLYCOLAX/MIRALAX) powder   Oral   Take 17 g by mouth daily.   850 g   1   . sitaGLIPtin (JANUVIA) 100 MG tablet      Take  one daily for diabetes   30 tablet   4   . triamcinolone (NASACORT AQ) 55 MCG/ACT nasal inhaler   Nasal   Place 2 sprays into the nose daily.   1 Inhaler   0   . valACYclovir (VALTREX) 1000 MG tablet      1/2 po qd, If you get an outbreak increase dose to 1/2 pill bid for 3 days then returned to daily dosage.   30 tablet   11   . diltiazem 2 % GEL   Topical   Apply 1 application topically 3 (three) times daily.   30 g   0     BP 140/89  Pulse 97  Temp(Src) 98.4 F (36.9 C) (Oral)  Resp 18  SpO2 100%  Physical Exam  Nursing note and vitals reviewed. Constitutional: He is oriented to person, place, and time. He appears well-developed and well-nourished.  HENT:  Head: Normocephalic and atraumatic.  Mouth/Throat: Oropharynx is clear and moist.  Eyes: Conjunctivae and EOM are normal. Pupils are equal, round, and reactive to light.  Neck: Normal range of motion.  Cardiovascular: Normal rate, regular rhythm and normal heart sounds.   Pulmonary/Chest: Effort normal and breath sounds normal. He has no decreased breath sounds.  Abdominal: Soft. Bowel  sounds are normal.  Genitourinary: Rectal exam shows no external hemorrhoid and no fissure.     Neurological: He is alert and oriented to person, place, and time.  CN grossly intact  Skin: Skin is warm and dry.  Psychiatric: He has a normal mood and affect.    ED Course  Procedures (including critical care time)  Labs Reviewed - No data to display No results found.   1. Abscess       MDM   47 y.o. Male presented to the ED with buttock abscess.  Abscess is small, non-draining, non-fluctuant, and mildly TTP.  Has been using lidocaine numbing cream from PCP without relief of sx.  Pt is adamant about not wanting it lanced, and i do not feel this is necessary.  Will start on Bactrim.  May continue using lidocaine cream and OTC pain relievers for added relief.  Instructions given on abscess after-care.  Follow up with PCP if symptoms are not improving.      Garlon Hatchet, PA-C 11/03/12 2035

## 2012-11-03 NOTE — Telephone Encounter (Signed)
Pt called again - forwarded to clinical. bf

## 2012-11-03 NOTE — Telephone Encounter (Signed)
Patient called states he's bole is getting bigger.  Advised patient to return to clinic.  Patient states understanding.

## 2012-11-03 NOTE — ED Notes (Signed)
Pt c/o abscess to buttocks x2 weeks. Pt states he was seen by his pcp and given cream which did not help.

## 2012-11-04 ENCOUNTER — Telehealth: Payer: Self-pay | Admitting: Physician Assistant

## 2012-11-04 NOTE — Telephone Encounter (Signed)
It is my understanding that the patient called and stated that I did not send in his Lunesta or Diltiazem gel.  I reviewed the medication list and they were both sent to [facility] but I did tell him that they may not have the diltiazem gel because it may need to be compounded. We also discussed that his insurance will not cover #30 Lunesta a month and he needs to call his insurance company to see what he can do to get more than #15 a month.  I LMOM with this information - asked that patient call back with any concerns.

## 2012-11-04 NOTE — ED Provider Notes (Signed)
Medical screening examination/treatment/procedure(s) were performed by non-physician practitioner and as supervising physician I was immediately available for consultation/collaboration.  Tobin Chad, MD 11/04/12 [phone]

## 2012-11-25 ENCOUNTER — Telehealth: Payer: Self-pay | Admitting: Family Medicine

## 2012-11-25 NOTE — Telephone Encounter (Signed)
Pt's dentist called with concerns b/c Pt is brand new to them and made an appointment with them- he stated he had recently been hospitalized with a diabetic coma and trach. They are concerned b/c Pt is complaining of a broken tooth, canker sore, and toothache. Receptionist is concerned if they need to do anything as far as pre-op with him- they only anticipate to do local numbing if needed and deep cleaning if necessary.  Please advise and call them back today- Pt has appointment at 8am tomorrow =) Eileen Stanford

## 2012-11-25 NOTE — Telephone Encounter (Signed)
Local anesthesia should be fine for this patient.  They could check his sugar if they are concerned about that.

## 2012-11-26 NOTE — Telephone Encounter (Signed)
His appt was at 8 am, no need to call now, I just arrived, it is 9:30

## 2012-12-03 ENCOUNTER — Ambulatory Visit: Payer: BC Managed Care – PPO | Admitting: *Deleted

## 2013-01-05 ENCOUNTER — Telehealth: Payer: Self-pay

## 2013-01-05 NOTE — Telephone Encounter (Signed)
Called him he states in July he was told to stop Rx, by Dr Claudie Revering. I have advised him to come in for this. He states he will come in for this. He states he has never seen a Insurance account manager.

## 2013-01-05 NOTE — Telephone Encounter (Signed)
Unclear from chart review when this was d/c called patient. Voice greeting not clear if this is the wrong #. Did not leave message. Patient needs to come in for this.

## 2013-01-05 NOTE — Telephone Encounter (Signed)
PT STATES HE WAS TAKEN OFF THE DIALANTUS FROM THE DR, BUT NEED TO GET BACK ON. PLEASE CALL [phone]     [facility] ON [address] AT [phone]

## 2013-01-16 ENCOUNTER — Telehealth: Payer: Self-pay

## 2013-01-16 NOTE — Telephone Encounter (Signed)
[facility] is requesting a Rx for pt's Dilantin 100 mg caps, 1 cp BID #60. They stated that this had been written by another provider previously. Do you want to Rx it for pt?

## 2013-01-19 NOTE — Telephone Encounter (Signed)
Pt did not tell me he was on dilantin - this needs to come from a neurologist.

## 2013-01-20 NOTE — Telephone Encounter (Signed)
Sent denial to pharmacy, so they can request med from the appropriate physician

## 2013-01-30 ENCOUNTER — Other Ambulatory Visit: Payer: Self-pay | Admitting: Physician Assistant

## 2013-02-11 ENCOUNTER — Other Ambulatory Visit: Payer: Self-pay | Admitting: Physician Assistant

## 2013-02-11 ENCOUNTER — Other Ambulatory Visit: Payer: Self-pay

## 2013-02-11 DIAGNOSIS — A6000 Herpesviral infection of urogenital system, unspecified: Secondary | ICD-10-CM

## 2013-02-11 MED ORDER — VALACYCLOVIR HCL 1 G PO TABS: ORAL_TABLET | ORAL | Status: DC

## 2013-02-20 ENCOUNTER — Encounter (HOSPITAL_COMMUNITY): Payer: Self-pay | Admitting: Emergency Medicine

## 2013-02-20 ENCOUNTER — Emergency Department (HOSPITAL_COMMUNITY)
Admission: EM | Admit: 2013-02-20 | Discharge: 2013-02-20 | Disposition: A | Discharge status: Home / Self Care | Payer: BC Managed Care – PPO | Attending: Emergency Medicine | Admitting: Emergency Medicine

## 2013-02-20 DIAGNOSIS — X58XXXA Exposure to other specified factors, initial encounter: Secondary | ICD-10-CM | POA: Insufficient documentation

## 2013-02-20 DIAGNOSIS — Z794 Long term (current) use of insulin: Secondary | ICD-10-CM | POA: Insufficient documentation

## 2013-02-20 DIAGNOSIS — Y9389 Activity, other specified: Secondary | ICD-10-CM | POA: Insufficient documentation

## 2013-02-20 DIAGNOSIS — S21209A Unspecified open wound of unspecified back wall of thorax without penetration into thoracic cavity, initial encounter: Secondary | ICD-10-CM | POA: Insufficient documentation

## 2013-02-20 DIAGNOSIS — E119 Type 2 diabetes mellitus without complications: Secondary | ICD-10-CM | POA: Insufficient documentation

## 2013-02-20 DIAGNOSIS — F411 Generalized anxiety disorder: Secondary | ICD-10-CM | POA: Insufficient documentation

## 2013-02-20 DIAGNOSIS — I10 Essential (primary) hypertension: Secondary | ICD-10-CM | POA: Insufficient documentation

## 2013-02-20 DIAGNOSIS — Z79899 Other long term (current) drug therapy: Secondary | ICD-10-CM | POA: Insufficient documentation

## 2013-02-20 DIAGNOSIS — L98491 Non-pressure chronic ulcer of skin of other sites limited to breakdown of skin: Secondary | ICD-10-CM

## 2013-02-20 DIAGNOSIS — Y9289 Other specified places as the place of occurrence of the external cause: Secondary | ICD-10-CM | POA: Insufficient documentation

## 2013-02-20 MED ORDER — SULFAMETHOXAZOLE-TRIMETHOPRIM 800-160 MG PO TABS: 1.0000 | ORAL_TABLET | Freq: Two times a day (BID) | ORAL | Status: DC

## 2013-02-20 NOTE — ED Provider Notes (Signed)
History     CSN: [id number]  Arrival date & time 02/20/13  1118   First MD Initiated Contact with Patient 02/20/13 1135      Chief Complaint  Patient presents with  . Insect Bite    (Consider location/radiation/quality/duration/timing/severity/associated sxs/prior treatment) HPI  Ronald Mcdaniel is a 47 y.o.male presenting to the ER with complaints of wound on back that wont heal. He is a type II diabetic and was bite 1 year ago by a bug. It recently scabbed and he accidentally scrapped it off, now the exposed area of skin is spreading on his back and mildly pain. Has a PCP that he won't be able to see before the weekend. His wife has been changing the dressing and keeping it clean. Denies n,v,d, fevers, chills, weakness or change in diabetes control.   Past Medical History  Diagnosis Date  . Diabetes mellitus   . Hypertension   . Anxiety     Past Surgical History  Procedure Laterality Date  . Tracheostomy in 2006      Family History  Problem Relation Age of Onset  . Diabetes Sister   . Thyroid disease Sister   . Diabetes Brother     History  Substance Use Topics  . Smoking status: Never Smoker   . Smokeless tobacco: Not on file  . Alcohol Use: No      Review of Systems  Skin: Positive for wound.  All other systems reviewed and are negative.     Allergies  Review of patient's allergies indicates no known allergies.  Home Medications   Current Outpatient Rx  Name  Route  Sig  Dispense  Refill  . amLODipine (NORVASC) 10 MG tablet      Take one daily for blood pressure   30 tablet   4   . atorvastatin (LIPITOR) 20 MG tablet   Oral   Take 1 tablet (20 mg total) by mouth daily. DUE FOR RECHECK. PATIENT NEEDS OFFICE VISIT FOR ADDITIONAL REFILLS   30 tablet   0   . benzonatate (TESSALON) 100 MG capsule      1-2 po tid prn cough   40 capsule   0   . cloNIDine (CATAPRES) 0.2 MG tablet      Take one twice daily for blood pressure   60 tablet   4   . Eszopiclone (LUNESTA) 3 MG TABS      Take one pill immediately before bedtime   30 tablet   4     Needs office visit   . gabapentin (NEURONTIN) 100 MG capsule   Oral   Take 1 capsule (100 mg total) by mouth 3 (three) times daily.   90 capsule   4   . insulin NPH-insulin regular (NOVOLIN 70/30) (70-30) 100 UNIT/ML injection   Subcutaneous   Inject 65 Units into the skin 2 (two) times daily. Use 60 units twice daily         . olmesartan-hydrochlorothiazide (BENICAR HCT) 40-25 MG per tablet      Take one daily for blood pressure   30 tablet   4   . polyethylene glycol powder (GLYCOLAX/MIRALAX) powder   Oral   Take 17 g by mouth daily.   850 g   1   . sitaGLIPtin (JANUVIA) 100 MG tablet      Take one daily for diabetes   30 tablet   4   . sulfamethoxazole-trimethoprim (SEPTRA DS) 800-160 MG per tablet   Oral   Take 1  tablet by mouth every 12 (twelve) hours.   10 tablet   0   . triamcinolone (NASACORT AQ) 55 MCG/ACT nasal inhaler   Nasal   Place 2 sprays into the nose daily.   1 Inhaler   0   . valACYclovir (VALTREX) 1000 MG tablet      1/2 po qd, If you get an outbreak increase dose to 1/2 pill bid for 3 days then returned to daily dosage.   30 tablet   8     BP 155/86  Pulse 95  Temp(Src) 98.1 F (36.7 C)  Resp 18  Physical Exam  Nursing note and vitals reviewed. Constitutional: He appears well-developed and well-nourished. No distress.  HENT:  Head: Normocephalic and atraumatic.  Eyes: Pupils are equal, round, and reactive to light.  Neck: Normal range of motion. Neck supple.  Cardiovascular: Normal rate and regular rhythm.   Pulmonary/Chest: Effort normal.  Abdominal: Soft.  Musculoskeletal:       Back:  Neurological: He is alert.  Skin: Skin is warm and dry.    ED Course  Procedures (including critical care time)  Labs Reviewed - No data to display No results found.   1. Non-healing non-surgical wound, limited to breakdown of  skin       MDM  Wound doesn't appear infected but not healing and patient describes it has spreading.   Rx Bactrim. Patient to follow-up with his PCP very closely to ensure that wound heals appropriately.  47 y.o.Ronald Mcdaniel evaluation in the Emergency Department is complete. It has been determined that no acute conditions requiring further emergency intervention are present at this time. The patient/guardian have been advised of the diagnosis and plan. We have discussed signs and symptoms that warrant return to the ED, such as changes or worsening in symptoms.  Vital signs are stable at discharge. Filed Vitals:   02/20/13 1130  BP: 155/86  Pulse: 95  Temp: 98.1 F (36.7 C)  Resp: 18    Patient/guardian has voiced understanding and agreed to follow-up with the PCP or specialist.         Dorthula Matas, PA-C 02/20/13 1229

## 2013-02-20 NOTE — ED Notes (Signed)
States he was bite by something a year ago on his back-noticed area was getting bigger-went to urgent care and they said it was ok-no drainage, swelling

## 2013-02-20 NOTE — ED Provider Notes (Signed)
Medical screening examination/treatment/procedure(s) were performed by non-physician practitioner and as supervising physician I was immediately available for consultation/collaboration.  Velia Pamer, MD 02/20/13 1600 

## 2013-03-04 ENCOUNTER — Telehealth: Payer: Self-pay

## 2013-03-04 DIAGNOSIS — I10 Essential (primary) hypertension: Secondary | ICD-10-CM

## 2013-03-04 MED ORDER — CLONIDINE HCL 0.2 MG PO TABS: ORAL_TABLET | ORAL | Status: DC

## 2013-03-04 NOTE — Telephone Encounter (Signed)
Sent in

## 2013-03-04 NOTE — Telephone Encounter (Signed)
Pt states that his pharmacy [facility] on [address] states that they have sent over multiple requests for a refill on clonidine and they have not received a response as of yet, pt states that he really need this medication as soon as possible. Best# [phone]

## 2013-03-11 ENCOUNTER — Telehealth: Payer: Self-pay

## 2013-03-11 DIAGNOSIS — I10 Essential (primary) hypertension: Secondary | ICD-10-CM

## 2013-03-11 DIAGNOSIS — E1140 Type 2 diabetes mellitus with diabetic neuropathy, unspecified: Secondary | ICD-10-CM

## 2013-03-11 MED ORDER — AMLODIPINE BESYLATE 10 MG PO TABS: ORAL_TABLET | ORAL | Status: DC

## 2013-03-11 NOTE — Telephone Encounter (Signed)
Pt states that he spoke with [facility] on [address] and they advised him that they had sent over a request for his medications about 3 days ago, the medications that he is needing is amLODipine (NORVASC) 10 MG tablet, gabapentin (NEURONTIN) 100 MG capsule, and Ambiene (I did not see this on his medication list).  Best# [phone]

## 2013-03-11 NOTE — Telephone Encounter (Signed)
Patient would like referral on Ambien and Gabapentin.

## 2013-03-11 NOTE — Telephone Encounter (Signed)
Patient called wanting to check status of med refills. I explained clonidine was sent in on July 2 and we received message today for amlodipine, gabapentin, and Ambien. Told him amlodipine was sent in already and was awaiting approval for refill on gabapentin and Ambien. He states he has been waiting for refills for 2 weeks. He also needed refill on Lipitor. Please advise refills on gabapentin, Ambien, and Lipitor. [facility] [place]. Patient stated sees Dr. Alwyn Ren and Benny Lennert.

## 2013-03-11 NOTE — Telephone Encounter (Signed)
Sent in amlodipine please advised on Gabapentin (pended) and Ambien/ I do not see where we have been giving the Ambien.

## 2013-03-12 MED ORDER — GABAPENTIN 100 MG PO CAPS: 100.0000 mg | ORAL_CAPSULE | Freq: Three times a day (TID) | ORAL | Status: DC

## 2013-03-12 NOTE — Telephone Encounter (Signed)
Refill in each one time, then needs seen before any more can be prescribed. The note from [place] said "would like referral"but I assume it was supposed to say "would like a refill"  

## 2013-03-12 NOTE — Telephone Encounter (Signed)
Sent gabapentin, but can not refill the Ambien/ because there is not a rx on file, do you want 10mg  #30? Pended, please advise. Will change if you want.

## 2013-03-12 NOTE — Telephone Encounter (Signed)
Refill in each one time, then needs seen before any more can be prescribed. The note from [place] said "would like referral"but I assume it was supposed to say "would like a refill"

## 2013-03-13 NOTE — Telephone Encounter (Signed)
Call:  Looks like we did not prescribe this.  He needs seen for rx.

## 2013-03-16 NOTE — Telephone Encounter (Signed)
Lm to advise

## 2013-04-05 ENCOUNTER — Ambulatory Visit (INDEPENDENT_AMBULATORY_CARE_PROVIDER_SITE_OTHER): Payer: BC Managed Care – PPO | Admitting: Physician Assistant

## 2013-04-05 VITALS — BP 122/82 | HR 92 | Temp 98.1°F | Resp 18 | Ht 71.0 in | Wt 347.0 lb

## 2013-04-05 DIAGNOSIS — S21209A Unspecified open wound of unspecified back wall of thorax without penetration into thoracic cavity, initial encounter: Secondary | ICD-10-CM

## 2013-04-05 LAB — POCT CBC
Granulocyte percent: 58.6 %G (ref 37–80)
Lymph, poc: 2.2 (ref 0.6–3.4)
MCHC: 32 g/dL (ref 31.8–35.4)
MCV: 87.5 fL (ref 80–97)
MID (cbc): 0.4 (ref 0–0.9)
POC LYMPH PERCENT: 35.1 %L (ref 10–50)
Platelet Count, POC: 210 10*3/uL (ref 142–424)
RDW, POC: 15.6 %

## 2013-04-05 MED ORDER — MUPIROCIN 2 % EX OINT: TOPICAL_OINTMENT | Freq: Three times a day (TID) | CUTANEOUS | Status: DC

## 2013-04-05 MED ORDER — DOXYCYCLINE HYCLATE 100 MG PO CAPS: 100.0000 mg | ORAL_CAPSULE | Freq: Two times a day (BID) | ORAL | Status: DC

## 2013-04-05 NOTE — Progress Notes (Signed)
  Subjective:    Patient ID: Ronald Mcdaniel, male    DOB: 05-30-1966, 47 y.o.   MRN: [id number]  HPI 46 y.o.male presents to clinic today with omplaints of wound on back that wont heal from a spider bite that occurred approximately 1 year ago. It recently scabbed and he accidentally scrapped it off, now the exposed area of skin is spreading on his back and mildly painful. He is a type II diabetic. He was seen in ER at the end of June for this and was given Bactrim and states that it did not help at all. His wife has been changing the dressing and keeping it clean. He has been keeping it covered and putting antibiotic ointment on it. He has subjective fever and chills. Denies n,v,d, headaches, back pain, weakness or change in diabetes control.    Review of Systems  Constitutional: Positive for fever and chills. Negative for fatigue.  HENT: Negative for neck pain and neck stiffness.   Eyes: Negative for visual disturbance.  Respiratory: Negative for chest tightness and shortness of breath.   Cardiovascular: Negative for chest pain and palpitations.  Gastrointestinal: Negative for nausea, vomiting and abdominal pain.  Genitourinary: Negative for difficulty urinating.  Musculoskeletal: Negative for myalgias, back pain and arthralgias.  Skin: Positive for wound.  Neurological: Negative for dizziness, light-headedness and headaches.  All other systems reviewed and are negative.       Objective:   Physical Exam  Nursing note and vitals reviewed. Constitutional: He is oriented to person, place, and time. Vital signs are normal. He appears well-developed and well-nourished. No distress.  HENT:  Head: Normocephalic and atraumatic.  Left Ear: External ear normal.  Nose: Nose normal.  Eyes: Conjunctivae and lids are normal.  Neck: Trachea normal, normal range of motion and full passive range of motion without pain. Neck supple.  Cardiovascular: Normal rate, regular rhythm and normal heart sounds.     Pulmonary/Chest: Effort normal and breath sounds normal.  Abdominal: Normal appearance.  Musculoskeletal: Normal range of motion.  Lymphadenopathy:    He has no cervical adenopathy.  Neurological: He is alert and oriented to person, place, and time.  Skin: Skin is warm and dry. Lesion noted. No bruising, no ecchymosis and no rash noted. He is not diaphoretic. There is erythema. No pallor.     Psychiatric: He has a normal mood and affect. His speech is normal and behavior is normal. Judgment and thought content normal. Cognition and memory are normal.       Assessment & Plan:  Wound of back, unspecified laterality, initial encounter - Plan: POCT CBC, Wound culture, mupirocin ointment (BACTROBAN) 2 %, doxycycline (VIBRAMYCIN) 100 MG capsule  Instructed patient to use mupirocin ointment 3 times daily on wound. Instructed him to take the antibiotic Doxycycline as directed for 10 days. He should allow wound to be open to air so that scab can form. Instructed him to keep wound clean with soap and water. He may keep it covered while he is sleeping. When culture results are back we make any necessary changes and notify patient. Patient instructed to return in 1 week for follow up with his PCP, Benny Lennert, PA-C to recheck wound and for follow-up of his chronic medical problems.

## 2013-04-05 NOTE — Patient Instructions (Addendum)
Use mupirocin ointment 3 times daily on wound  Take the antibiotic Doxycycline as directed for 10 days Allow wound to be open to air so that scab can form Keep clean with soap and water May keep it covered while you are sleeping When culture results are back we will be able to verify if this is correct antibiotic, if not well will contact you and change it Please return in 1 week for follow up with Benny Lennert so that this can be rechecked. She is available on Thursday the 8/7 from 8-6 pm or you can return to see [place] on Friday 8/8 from 8-6 pm

## 2013-04-05 NOTE — Progress Notes (Signed)
I have examined this patient along with the student and agree.  Patient has multiple chronic medical problems and appears non-compliant with follow-up with his PCP.  He needs re-evaluation and fasting labs, which can be done in 1 week when he should return for wound re-check.

## 2013-04-08 LAB — WOUND CULTURE

## 2013-04-13 ENCOUNTER — Other Ambulatory Visit: Payer: Self-pay | Admitting: Physician Assistant

## 2013-04-14 ENCOUNTER — Telehealth: Payer: Self-pay

## 2013-04-14 MED ORDER — INSULIN NPH ISOPHANE & REGULAR (70-30) 100 UNIT/ML ~~LOC~~ SUSP: 65.0000 [IU] | Freq: Two times a day (BID) | SUBCUTANEOUS | Status: DC

## 2013-04-14 MED ORDER — CLONIDINE HCL 0.2 MG PO TABS: 0.2000 mg | ORAL_TABLET | Freq: Two times a day (BID) | ORAL | Status: DC

## 2013-04-14 NOTE — Telephone Encounter (Signed)
PT STATES HE IS OUT OF HIS CLONIDINE AND INSULIN, REALLY HE IS OUT OF ALL HIS MEDS, BUT NEED THOSE 2 ASAP PLEASE CALL [phone] OR [phone]   [facility] ON [address]

## 2013-04-14 NOTE — Telephone Encounter (Signed)
Rxs sent in and pt notified.

## 2013-04-15 ENCOUNTER — Telehealth: Payer: Self-pay

## 2013-04-15 MED ORDER — INSULIN NPH ISOPHANE & REGULAR (70-30) 100 UNIT/ML ~~LOC~~ SUSP: 65.0000 [IU] | Freq: Two times a day (BID) | SUBCUTANEOUS | Status: DC

## 2013-04-15 MED ORDER — CLONIDINE HCL 0.2 MG PO TABS: 0.2000 mg | ORAL_TABLET | Freq: Two times a day (BID) | ORAL | Status: DC

## 2013-04-15 NOTE — Telephone Encounter (Signed)
Patient states he pharmacy told him that his medications have been cancelled. Patient need further explanation. [phone]

## 2013-04-15 NOTE — Telephone Encounter (Signed)
I see no documentation in the record that the medications were cancelled.  Our clinical staff are calling the pharmacy to get details.

## 2013-04-15 NOTE — Telephone Encounter (Signed)
Resent. He wants to [employer] not to [employer].

## 2013-04-15 NOTE — Telephone Encounter (Signed)
Please advise, patients insulin and clonidine were sent in yesterday, but he states someone then called to cancel them, I se no record of them being cancelled, is it okay to resend?

## 2013-04-26 ENCOUNTER — Ambulatory Visit (INDEPENDENT_AMBULATORY_CARE_PROVIDER_SITE_OTHER): Payer: BC Managed Care – PPO | Admitting: Family Medicine

## 2013-04-26 VITALS — BP 130/88 | HR 102 | Temp 99.3°F | Resp 18 | Ht 70.0 in | Wt 341.0 lb

## 2013-04-26 DIAGNOSIS — M546 Pain in thoracic spine: Secondary | ICD-10-CM

## 2013-04-26 DIAGNOSIS — E1140 Type 2 diabetes mellitus with diabetic neuropathy, unspecified: Secondary | ICD-10-CM

## 2013-04-26 DIAGNOSIS — E1149 Type 2 diabetes mellitus with other diabetic neurological complication: Secondary | ICD-10-CM

## 2013-04-26 DIAGNOSIS — E1165 Type 2 diabetes mellitus with hyperglycemia: Secondary | ICD-10-CM

## 2013-04-26 DIAGNOSIS — IMO0002 Reserved for concepts with insufficient information to code with codable children: Secondary | ICD-10-CM

## 2013-04-26 DIAGNOSIS — I10 Essential (primary) hypertension: Secondary | ICD-10-CM

## 2013-04-26 DIAGNOSIS — K5900 Constipation, unspecified: Secondary | ICD-10-CM

## 2013-04-26 DIAGNOSIS — R070 Pain in throat: Secondary | ICD-10-CM

## 2013-04-26 DIAGNOSIS — E7800 Pure hypercholesterolemia, unspecified: Secondary | ICD-10-CM

## 2013-04-26 LAB — GLUCOSE, POCT (MANUAL RESULT ENTRY)

## 2013-04-26 LAB — COMPREHENSIVE METABOLIC PANEL
ALT: 27 U/L (ref 0–53)
AST: 18 U/L (ref 0–37)
CO2: 27 mEq/L (ref 19–32)
Calcium: 9.6 mg/dL (ref 8.4–10.5)
Chloride: 94 mEq/L — ABNORMAL LOW (ref 96–112)
Potassium: 3.5 mEq/L (ref 3.5–5.3)
Sodium: 132 mEq/L — ABNORMAL LOW (ref 135–145)
Total Protein: 7.7 g/dL (ref 6.0–8.3)

## 2013-04-26 LAB — POCT GLYCOSYLATED HEMOGLOBIN (HGB A1C): Hemoglobin A1C: >14

## 2013-04-26 LAB — LIPID PANEL

## 2013-04-26 MED ORDER — POLYETHYLENE GLYCOL 3350 17 GM/SCOOP PO POWD: 17.0000 g | Freq: Every day | ORAL | Status: DC

## 2013-04-26 MED ORDER — INSULIN GLARGINE 100 UNIT/ML SOLOSTAR PEN: 50.0000 [IU] | PEN_INJECTOR | Freq: Every day | SUBCUTANEOUS | Status: DC

## 2013-04-26 MED ORDER — AMLODIPINE BESYLATE 10 MG PO TABS: ORAL_TABLET | ORAL | Status: DC

## 2013-04-26 MED ORDER — CLONIDINE HCL 0.2 MG PO TABS: 0.6000 mg | ORAL_TABLET | Freq: Two times a day (BID) | ORAL | Status: DC

## 2013-04-26 MED ORDER — GABAPENTIN 100 MG PO CAPS: 100.0000 mg | ORAL_CAPSULE | Freq: Three times a day (TID) | ORAL | Status: DC

## 2013-04-26 MED ORDER — ATORVASTATIN CALCIUM 20 MG PO TABS: 20.0000 mg | ORAL_TABLET | Freq: Every day | ORAL | Status: DC

## 2013-04-26 MED ORDER — SITAGLIPTIN PHOSPHATE 100 MG PO TABS: ORAL_TABLET | ORAL | Status: DC

## 2013-04-26 MED ORDER — OLMESARTAN MEDOXOMIL-HCTZ 40-25 MG PO TABS: ORAL_TABLET | ORAL | Status: DC

## 2013-04-26 NOTE — Progress Notes (Signed)
History and physical exams reviewed in detail with Benny Lennert, PA-C.  Labs reviewed. Agree with A/P.

## 2013-04-26 NOTE — Patient Instructions (Signed)
3x/wk check sugar in the am 3x/wk check sugar 2 hours after you eat 3x/wl check sugar before bedtime  Keep a paper record of what you eat.  Recheck with me in 2 weeks and bring your paper record and your glucose monitor
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
## 2013-04-27 LAB — HEMOGLOBIN A1C
Hgb A1c MFr Bld: 16.9 % — ABNORMAL HIGH (ref <5.7)
Mean Plasma Glucose: 438 mg/dL — ABNORMAL HIGH (ref <117)

## 2013-04-28 ENCOUNTER — Telehealth: Payer: Self-pay | Admitting: *Deleted

## 2013-04-28 NOTE — Telephone Encounter (Signed)
Spoke to patient concerning returned mail sent to him. He gave me the new address which now has been entered into EPIC. Also, he advised me he was here on Sunday to be seen.

## 2013-05-06 ENCOUNTER — Encounter (INDEPENDENT_AMBULATORY_CARE_PROVIDER_SITE_OTHER): Payer: BC Managed Care – PPO | Admitting: Physician Assistant

## 2013-05-07 ENCOUNTER — Telehealth: Payer: Self-pay

## 2013-05-07 DIAGNOSIS — E1165 Type 2 diabetes mellitus with hyperglycemia: Secondary | ICD-10-CM

## 2013-05-07 DIAGNOSIS — E1140 Type 2 diabetes mellitus with diabetic neuropathy, unspecified: Secondary | ICD-10-CM

## 2013-05-07 MED ORDER — GABAPENTIN 100 MG PO CAPS: 100.0000 mg | ORAL_CAPSULE | Freq: Three times a day (TID) | ORAL | Status: DC

## 2013-05-07 MED ORDER — INSULIN GLARGINE 100 UNIT/ML SOLOSTAR PEN: 60.0000 [IU] | PEN_INJECTOR | Freq: Every day | SUBCUTANEOUS | Status: DC

## 2013-05-07 NOTE — Telephone Encounter (Signed)
He states the gabapentin was sent in, but he has been told he can not get this until the 9th. He states he did get the five pens, but has used them all/ please advise

## 2013-05-07 NOTE — Telephone Encounter (Signed)
I have refilled his Gabapentin and Lantus. Per patient he is taking 60 units daily instead of 50 units and he is taking 4 tablets of gabapentin instead of 3.  He has an appt on 9/17 to discuss meds with Maralyn Sago. To you FYI

## 2013-05-07 NOTE — Telephone Encounter (Signed)
I am confused - I know that the patient came to see me last night but he left before I was able to see him but on 8/24 I gave him his gabapentin and a month of Lantus so he should be out of either meds.  Please get me some information about why he is out?  Hoe is he using his insulin?  What are his sugars running?

## 2013-05-07 NOTE — Telephone Encounter (Signed)
Patient needs a refill on flex pen and gabapentin. Patient uses [facility] on [address].  [phone].

## 2013-05-11 NOTE — Progress Notes (Signed)
This encounter was created in error - please disregard.

## 2013-05-14 ENCOUNTER — Telehealth: Payer: Self-pay

## 2013-05-14 NOTE — Telephone Encounter (Signed)
We have sent multiple unable to reach letters on this pt due to not being able to reach him about his labs. He called [place] back today and was telling us that his foot still hurts and on his wound culture it looks like he was supposed to stop the Doxy and start Amox. Is it too late to send this in or does he need a recheck.

## 2013-05-15 ENCOUNTER — Other Ambulatory Visit: Payer: Self-pay | Admitting: Family Medicine

## 2013-05-17 ENCOUNTER — Telehealth: Payer: Self-pay

## 2013-05-17 NOTE — Telephone Encounter (Signed)
PATIENT STATES HE NEEDS HIS AMOXICILLIAN [phone]

## 2013-05-17 NOTE — Telephone Encounter (Signed)
RTC.  It has been over 1 month now since he was initially seen for the wound

## 2013-05-18 ENCOUNTER — Telehealth: Payer: Self-pay | Admitting: Radiology

## 2013-05-18 NOTE — Telephone Encounter (Signed)
Left message for him to call me back.  

## 2013-05-18 NOTE — Telephone Encounter (Signed)
Spoke to patient, he has seen [facility] for his foot. He is complaining of back painful. He has appt scheduled for this, with Maralyn Sago on Wed. This conversation is very confusing for [place] both. I told him to take meds as directed by his foot doctor. He states he is using the Doxy and Amox for his back, not his foot. I do not see any mention of this for his back. I do not know what he is taking, and neither does he. He will come in on Wed. I told him he needs his back and his diabetes rechecked. To you FYI

## 2013-05-18 NOTE — Telephone Encounter (Signed)
Ronald Mcdaniel, it looks like you originally saw pt for insomnia and are his PCP. Do you want to RF Lunesta or do you want me to send to Dr Alwyn Ren who last Rxd it?

## 2013-05-18 NOTE — Telephone Encounter (Signed)
Spoke to patients wife, she has called back for him. She is advised to have him take the antibiotic for the foot, but to take the one the foot doctor has recommended. She is asking if he should get the antibiotic from [place] as well. The foot doctor has already given him Amox, and this would be double dosing and this would not be beneficial. The area on his back has gotten worse. It has been VERY difficult to advise on the phone, advised her to bring him in TODAY for this. Not to wait until Wed to see Maralyn Sago, the area may need to be opened. Wife understood. This is good, since patient did not seem to understand. To you FYI

## 2013-05-20 ENCOUNTER — Telehealth: Payer: Self-pay | Admitting: Family Medicine

## 2013-05-20 ENCOUNTER — Ambulatory Visit (INDEPENDENT_AMBULATORY_CARE_PROVIDER_SITE_OTHER): Payer: BC Managed Care – PPO | Admitting: Physician Assistant

## 2013-05-20 VITALS — BP 129/89 | HR 100 | Temp 98.4°F | Resp 18 | Ht 70.0 in | Wt 339.0 lb

## 2013-05-20 DIAGNOSIS — G4700 Insomnia, unspecified: Secondary | ICD-10-CM

## 2013-05-20 DIAGNOSIS — I10 Essential (primary) hypertension: Secondary | ICD-10-CM

## 2013-05-20 DIAGNOSIS — N189 Chronic kidney disease, unspecified: Secondary | ICD-10-CM

## 2013-05-20 DIAGNOSIS — R12 Heartburn: Secondary | ICD-10-CM

## 2013-05-20 DIAGNOSIS — Z9119 Patient's noncompliance with other medical treatment and regimen: Secondary | ICD-10-CM

## 2013-05-20 DIAGNOSIS — Z23 Encounter for immunization: Secondary | ICD-10-CM

## 2013-05-20 DIAGNOSIS — E7800 Pure hypercholesterolemia, unspecified: Secondary | ICD-10-CM

## 2013-05-20 DIAGNOSIS — D2920 Benign neoplasm of unspecified testis: Secondary | ICD-10-CM

## 2013-05-20 DIAGNOSIS — E1165 Type 2 diabetes mellitus with hyperglycemia: Secondary | ICD-10-CM

## 2013-05-20 MED ORDER — ATORVASTATIN CALCIUM 20 MG PO TABS: 20.0000 mg | ORAL_TABLET | Freq: Every day | ORAL | Status: DC

## 2013-05-20 MED ORDER — OMEPRAZOLE 40 MG PO CPDR: 40.0000 mg | DELAYED_RELEASE_CAPSULE | Freq: Every day | ORAL | Status: DC

## 2013-05-20 MED ORDER — AMLODIPINE BESYLATE 10 MG PO TABS: ORAL_TABLET | ORAL | Status: DC

## 2013-05-20 MED ORDER — ESZOPICLONE 3 MG PO TABS: 3.0000 mg | ORAL_TABLET | Freq: Every day | ORAL | Status: DC

## 2013-05-20 MED ORDER — OLMESARTAN MEDOXOMIL-HCTZ 40-25 MG PO TABS: ORAL_TABLET | ORAL | Status: DC

## 2013-05-20 MED ORDER — ESOMEPRAZOLE MAGNESIUM 40 MG PO CPDR: 40.0000 mg | DELAYED_RELEASE_CAPSULE | Freq: Every day | ORAL | Status: DC

## 2013-05-20 MED ORDER — ATORVASTATIN CALCIUM 40 MG PO TABS: 20.0000 mg | ORAL_TABLET | Freq: Every day | ORAL | Status: DC

## 2013-05-20 MED ORDER — CLONIDINE HCL 0.2 MG PO TABS: 0.6000 mg | ORAL_TABLET | Freq: Two times a day (BID) | ORAL | Status: DC

## 2013-05-20 MED ORDER — SITAGLIPTIN PHOSPHATE 100 MG PO TABS: ORAL_TABLET | ORAL | Status: DC

## 2013-05-20 NOTE — Telephone Encounter (Signed)
Pt needs test strips and lancets for free style light. Spoke with pharm and called in both 100 with a yr RF per Dr. Clelia Croft

## 2013-05-20 NOTE — Patient Instructions (Addendum)
Continue insulin Lantus 60u 2x/day like you were doing  Check your sugar in the morning when you wake up, 2h after you eat, and then before bed every night for 2 weeks.  Also write down everything that you eat and drink and the times.  Bring that to your appt in 2 weeks.

## 2013-05-21 ENCOUNTER — Telehealth: Payer: Self-pay

## 2013-05-21 MED ORDER — ACCU-CHEK SOFT TOUCH LANCETS MISC: Status: AC

## 2013-05-21 MED ORDER — GLUCOSE BLOOD VI STRP: ORAL_STRIP | Status: DC

## 2013-05-21 MED ORDER — BLOOD GLUCOSE MONITOR KIT: PACK | Status: DC

## 2013-05-21 NOTE — Telephone Encounter (Signed)
lmom that Rxs were sent in for meter, test strips and lancets - Accu-chek.

## 2013-05-21 NOTE — Telephone Encounter (Signed)
Patient's wife states that patient would like to try Accucheck?? Test strips for diabetes because she heard they are free. Patient went to pick his freestyle test strips and they were $126. [facility]. [phone].

## 2013-05-21 NOTE — Progress Notes (Signed)
[address], [place] [address]   Phone [phone]  Subjective:    Patient ID: Ronald Mcdaniel, male    DOB: 08-Sep-1965, 47 y.o.   MRN: [id number]  HPI Pt presents to clinic for recheck.  He has not been checking his sugars like we discussed.  He has been using Lantus 60U bid.  He states he is still walking 2h a day and only eating yogart and drinking water but when I asked him what he ate prior to coming here he told me he had a whopper but then when questioned he stated he had that yesterday.  He states he is taking his medication daily but he need refills already.    He would like something for heartburn - he has been on something in the past but cannot recall which one worked for him.  He would like to restart Lunesta - he thinks that worked the best out of the sleep meds.  Pt has seen the podiatrist who suggested he be on something for swelling in his feet.   Review of Systems     Objective:   Physical Exam  Vitals reviewed. Constitutional: He is oriented to person, place, and time. He appears well-developed and well-nourished.  Minimal eye contact during the visit, esp when questioned about diet, habits and meds.  Pt changes the conversation multiple times and I have to redirect him during the visit.  Eyes: Conjunctivae are normal.  Cardiovascular: Normal rate, regular rhythm and normal heart sounds.   No murmur heard. LE edema 1-2+ pitting edema  Pulmonary/Chest: Effort normal and breath sounds normal.  Neurological: He is alert and oriented to person, place, and time.  Skin: Skin is warm and dry.  Psychiatric: He has a normal mood and affect. His behavior is normal. Judgment and thought content normal.   Results for orders placed in visit on 05/20/13  GLUCOSE, POCT (MANUAL RESULT ENTRY)      Result Value Range   POC Glucose 438 (*) 70 - 99 mg/dl      Assessment & Plan:  Diabetes type 2, uncontrolled - I am still concerned that he is not being truthful about either  his diet or medication use - it is possible that he is not using his insulin correctly and I will have them call him prior to his appt in 2 weeks to have him bring his insulin to show me how he is using it.  Plan: POCT glucose (manual entry), sitaGLIPtin (JANUVIA) 100 MG tablet  Need for prophylactic vaccination and inoculation against influenza - Plan: Flu Vaccine QUAD 36+ mos IM  HTN (hypertension) - Plan: amLODipine (NORVASC) 10 MG tablet, cloNIDine (CATAPRES) 0.2 MG tablet, olmesartan-hydrochlorothiazide (BENICAR HCT) 40-25 MG per tablet  Pure hypercholesterolemia - Triglycerides are significantly elevated due to uncontrolled DM so we will increase his lipitor.  Patients recalled diet does not seem to coincide with his lab results or weight.  Plan:  atorvastatin (LIPITOR) 40 MG tablet  Insomnia - Plan: Eszopiclone (ESZOPICLONE) 3 MG TABS  Heartburn - Trial to see if pt gets improved symptoms.  If what pt is reporting to be his diet is what her is really eating he is not having enough caloric intake and that might be aggravating his heartburn.  Plan: omeprazole (PRILOSEC) 40 MG capsule  Leg swelling - pt definitely has leg swelling but due to his kidney dysfunction I would like the nephrologist to guide me on the treatment with Lasix.  We are setting up  an appt for the patient with the nephrologist.  Benny Lennert PA-C 05/21/2013 9:31 AM

## 2013-05-22 ENCOUNTER — Telehealth: Payer: Self-pay

## 2013-05-22 NOTE — Telephone Encounter (Signed)
Wife is not on his HIPAA, I spoke to patient he is advised to return to clinic. His arm is red and swollen. He states he does not want to see Benny Lennert, I advised him any of our providers would be happy to look at his arm. He asked if he has to pay a copay, I advised him yes, this is his agreement with his insurance company, he has to pay his copay. This is between him and his insurance company. He disconnected call. To you FYI

## 2013-05-22 NOTE — Telephone Encounter (Signed)
PT's wife is calling because when he came in and got flu shot and since then his arm has been hurting.  Call back number is [phone]

## 2013-05-22 NOTE — Telephone Encounter (Signed)
Patient's records are ready for pick up. Patients voicemail says "Ronald Mcdaniel", I called multiple times and it was sent to voicemail. Only number we have on file is: [phone]. Please notify patient that his records he requested are ready for pick up.

## 2013-05-25 ENCOUNTER — Telehealth: Payer: Self-pay

## 2013-05-25 MED ORDER — INSULIN GLARGINE 100 UNIT/ML SOLOSTAR PEN: 50.0000 [IU] | PEN_INJECTOR | Freq: Every day | SUBCUTANEOUS | Status: DC

## 2013-05-25 NOTE — Telephone Encounter (Signed)
Thanks, his medications have all been sent in. What does he need? He needs Lantus. States pens.  He needs 15 pens for a one month supply, but at last visit, this is marked as d/c solution was sent in. He is requesting pens, pended.

## 2013-05-25 NOTE — Telephone Encounter (Signed)
PT STATES HE IS GOING TO RUN OUT OF HIS DIABETIC MEDICINE BEFORE HE COMES BACK IN. PLEASE CALL            [facility] [address]

## 2013-05-25 NOTE — Telephone Encounter (Signed)
Noted  

## 2013-05-25 NOTE — Telephone Encounter (Signed)
WASN'T ABLE TO AMEND FIRST PHONE MESSAGE TO PUT IN A CALL BACK NUMBER.

## 2013-05-25 NOTE — Telephone Encounter (Signed)
I can not pend the pens, the computer changes pens to vials. Please send. Patient needs 15 pens for 1 mo supply 50 units a day.

## 2013-05-25 NOTE — Telephone Encounter (Signed)
[phone]

## 2013-05-25 NOTE — Telephone Encounter (Signed)
Sent pens to pharmacy

## 2013-05-26 ENCOUNTER — Other Ambulatory Visit: Payer: Self-pay | Admitting: Radiology

## 2013-05-26 NOTE — Telephone Encounter (Signed)
Patient indicates he runs out of lantus today. Pharmacy indicates it is too soon to fill. He should not run out until Oct 4th. Will you please help me with this. Patient indicates one pen lasts 1 and 1/2 days, but one pen should last longer. Amy

## 2013-05-26 NOTE — Telephone Encounter (Signed)
Lmom for pt to cb.  In Sarah's note from 05/20/13 pt was advised to continue using his lantus 60u bid as he stated he was using.  Which 1 pen should last him 2 and 1/2 days and 5 pens should last 12 and 1/2 days.  There is an rx pended for the right amount of pens and enough for 1 month for when pt calls back to make sure that he is taking medication correctly.

## 2013-05-26 NOTE — Addendum Note (Signed)
Addended by: Thelma Barge D on: 05/26/2013 03:31 PM   Modules accepted: Orders

## 2013-05-26 NOTE — Telephone Encounter (Signed)
Already was prescribed

## 2013-05-26 NOTE — Telephone Encounter (Signed)
rx is pended

## 2013-05-29 NOTE — Telephone Encounter (Signed)
Ronald Mcdaniel, patient not returning our calls, he indicates one pen lasts one and 1/2 days , but if he is running out this quickly, he is dosing incorrectly. We sent in renewals of the pens. This is very concerning. Amy

## 2013-06-03 ENCOUNTER — Telehealth: Payer: Self-pay

## 2013-06-03 ENCOUNTER — Ambulatory Visit: Payer: BC Managed Care – PPO | Admitting: Physician Assistant

## 2013-06-03 MED ORDER — GABAPENTIN 100 MG PO CAPS: 300.0000 mg | ORAL_CAPSULE | Freq: Two times a day (BID) | ORAL | Status: DC

## 2013-06-03 NOTE — Telephone Encounter (Signed)
I don't see in the chart what the increased dose is.  I'm happy to provide refills to get him through his rescheduled appointment, but I need to know the dose, etc.

## 2013-06-03 NOTE — Telephone Encounter (Signed)
His dose was not increased, he is asking for an increased dose,please advise, med helps, but he feels like he needs to increase, he is currently taking Gabapentin 200 bid.

## 2013-06-03 NOTE — Telephone Encounter (Signed)
Yes, I called him. He states today it was 125. Yesterday it was 213. Monday was 345. I still do not know how he is using the lantus, since one pen should last him 2 and 1/2 days, but it only lasts him 1 and 1/2 days.

## 2013-06-03 NOTE — Telephone Encounter (Signed)
Patient wife cancelled appt with Benny Lennert and will call back to reschedule. But patient needs refill on gabapentin. States the dose needs to be increased because he is taking more. Patient call back number is [phone] or wife at [phone].

## 2013-06-03 NOTE — Telephone Encounter (Signed)
PT is calling because he is needing a refill on his Gabapentin and he doesn't have an apt till Nov 1 with Benny Lennert. He states that he needs a higher dosage Pharmacy is [facility] on [address] Call back number is [phone] or [phone]

## 2013-06-03 NOTE — Telephone Encounter (Signed)
He can increase to 300mg  bid - that will be 3 pills 2x/day.  I will send to the pharmacy.  Also - will you please get some of his recent glucose readings from him or his wife since he had to cancel his appt.

## 2013-06-06 ENCOUNTER — Telehealth: Payer: Self-pay

## 2013-06-06 NOTE — Telephone Encounter (Signed)
PT SAYS THE LIPITOR IS NOT LASTING THE ENTIRE MONTH DUE TO HOW MANY HE IS SUPPOSED TO TAKE EACH DAY.  CAN WE CALL IN ANOTHER SCRIPT?  [phone]

## 2013-06-07 MED ORDER — INSULIN GLARGINE 100 UNIT/ML SOLOSTAR PEN: 60.0000 [IU] | PEN_INJECTOR | Freq: Two times a day (BID) | SUBCUTANEOUS | Status: DC

## 2013-06-07 NOTE — Telephone Encounter (Addendum)
Pt finally returned call.  Pt stated that he has been taking meds as was on the box which has been 60u BID.  I advised him that 1 pen should last him 2 and 1/2 days, 1 box of pens should last in 12 and 1/2 days.  I sent in an rx for 3 boxes that will last for 37 and 1/2 days. I also advised him that if [facility] was to bill correctly with day supply that he may only get 2 boxes for a 25 day supply.  (He has Medicaid). We both agreed that we are on the the same page of taking Lantus at 60u BID.    Clare Gandy.

## 2013-06-07 NOTE — Addendum Note (Signed)
Addended by: Thelma Barge D on: 06/07/2013 03:42 PM   Modules accepted: Orders

## 2013-06-07 NOTE — Telephone Encounter (Signed)
Pt calling about lantus

## 2013-06-16 ENCOUNTER — Telehealth: Payer: Self-pay

## 2013-06-16 ENCOUNTER — Other Ambulatory Visit: Payer: Self-pay | Admitting: Family Medicine

## 2013-06-16 ENCOUNTER — Ambulatory Visit: Payer: BC Managed Care – PPO | Admitting: Podiatry

## 2013-06-16 DIAGNOSIS — I10 Essential (primary) hypertension: Secondary | ICD-10-CM

## 2013-06-16 MED ORDER — CLONIDINE HCL 0.2 MG PO TABS: 0.6000 mg | ORAL_TABLET | Freq: Two times a day (BID) | ORAL | Status: DC

## 2013-06-16 NOTE — Telephone Encounter (Signed)
Ronald Mcdaniel STATES HER HUSBAND IS IN NEED OF HIS CLONIDINE MEDICATION, HAVE NO MORE REFILLS BUT THEY WOULD LIKE TO HAVE ADDITIONAL REFILLS ON HIS MEDS PLEASE CALL [phone] OR [phone]   [facility] [address]

## 2013-06-16 NOTE — Telephone Encounter (Signed)
Per Aug 2014 OV note from Benny Lennert, this is the correct dose - 0.6mg  BID.  Will send to pharmacy.  Pt due for OV mid-Dec for 3 month DM follow up

## 2013-06-16 NOTE — Telephone Encounter (Signed)
Please verify dose. Pended.

## 2013-06-17 ENCOUNTER — Other Ambulatory Visit: Payer: Self-pay

## 2013-06-17 DIAGNOSIS — E1165 Type 2 diabetes mellitus with hyperglycemia: Secondary | ICD-10-CM

## 2013-06-17 DIAGNOSIS — I10 Essential (primary) hypertension: Secondary | ICD-10-CM

## 2013-06-17 MED ORDER — OLMESARTAN MEDOXOMIL-HCTZ 40-25 MG PO TABS: ORAL_TABLET | ORAL | Status: DC

## 2013-06-17 MED ORDER — SITAGLIPTIN PHOSPHATE 100 MG PO TABS: ORAL_TABLET | ORAL | Status: DC

## 2013-06-23 ENCOUNTER — Ambulatory Visit: Payer: BC Managed Care – PPO | Admitting: Podiatry

## 2013-06-25 ENCOUNTER — Other Ambulatory Visit: Payer: Self-pay

## 2013-06-25 NOTE — Telephone Encounter (Signed)
LMOM for pt to CB. Sarah had received a fax from [employer] stating that pt's current Accu-chek aviva Plus and Freestyle lite strips are not covered. They suggest changing Rx to covered alternative, LifeScan One Touch. Does pt want to change testing strips? If so, does he need a new meter also?

## 2013-06-29 MED ORDER — BLOOD GLUCOSE MONITOR KIT: PACK | Status: AC

## 2013-06-29 MED ORDER — GLUCOSE BLOOD VI STRP: ORAL_STRIP | Status: AC

## 2013-06-29 NOTE — Telephone Encounter (Signed)
Pt stated he would like to try switching to the covered testing strips, LifeScan OneTouch. I will send in Rx for these along w/Rx for the meter. Pt also asked about PA on Benicar/HCTZ. I advised him that I sent in form for this on 10/23 and am waiting on decision from ins.

## 2013-06-30 NOTE — Telephone Encounter (Signed)
Dr Conley Rolls, I checked on PA for Benicar HCT and found that Benicar is a non-preferred med for [employer]. Their preferred are Diovan and losartan. Pt has to have failed two preferred so Benicar will be denied. Pt has tried lisinopril, amlodipine , HCTZ and clonidine. Do you want to Rx either of the preferred w/HCTZ?

## 2013-07-02 MED ORDER — LOSARTAN POTASSIUM 50 MG PO TABS: 50.0000 mg | ORAL_TABLET | Freq: Every day | ORAL | Status: DC

## 2013-07-02 MED ORDER — HYDROCHLOROTHIAZIDE 25 MG PO TABS: 25.0000 mg | ORAL_TABLET | Freq: Every day | ORAL | Status: DC

## 2013-07-02 NOTE — Telephone Encounter (Signed)
When pt calls back make sure to give him instr's in Dr Irwin Brakeman message to monitor BP and call if not controlled and to f/up in 1 mos for recheck.

## 2013-07-02 NOTE — Telephone Encounter (Signed)
I spoke w/Dr Conley Rolls because losartan HCT does not come in a 50/25. She stated it is better to do the meds separate while adjusting. She instr'd me to send in losartan 50 mg and HCTZ 25 mg. I have done this and left message for pt to call back.

## 2013-07-03 NOTE — Telephone Encounter (Signed)
Spoke to pt and gave him instr's for new medication and f/up. Pt agreed.

## 2013-07-25 ENCOUNTER — Other Ambulatory Visit: Payer: Self-pay | Admitting: Family Medicine

## 2013-07-27 MED ORDER — GABAPENTIN 400 MG PO CAPS: 400.0000 mg | ORAL_CAPSULE | Freq: Two times a day (BID) | ORAL | Status: DC

## 2013-07-27 NOTE — Telephone Encounter (Signed)
Patient states he takes 8/day 4 in the morning and 4 in the afternoon.

## 2013-07-27 NOTE — Telephone Encounter (Signed)
It is fine that he is taking Gabapentin 400mg  bid but at his last visit I ok'd 300mg  bid - It is really important that the patient does not change his medication dosing without approval.  I have given hi a 1 month supply of the higher dose - please make sure he understands that he will only be taking 1 pill bid but it is 400mg .  He needs to be seen before this runs out.  I was supposed to see him in Sept.  If he does not want to see me (that was in a phone message) that is fine but he needs to find someone here who he wants to see regularly.

## 2013-07-28 NOTE — Telephone Encounter (Signed)
error 

## 2013-07-29 NOTE — Telephone Encounter (Signed)
Phone not working at home #. LMOM on cell # to CB.

## 2013-07-29 NOTE — Telephone Encounter (Signed)
Called, no answer.

## 2013-07-31 NOTE — Telephone Encounter (Signed)
Pt still did not answer and has not CB. I left a detailed message w/info from Maralyn Sago and inst's on how to take new dose of gabapentin and need to RTC.

## 2013-08-05 ENCOUNTER — Other Ambulatory Visit: Payer: Self-pay | Admitting: Physician Assistant

## 2013-08-05 ENCOUNTER — Telehealth: Payer: Self-pay

## 2013-08-05 DIAGNOSIS — I10 Essential (primary) hypertension: Secondary | ICD-10-CM

## 2013-08-05 MED ORDER — CLONIDINE HCL 0.2 MG PO TABS: 0.3000 mg | ORAL_TABLET | Freq: Two times a day (BID) | ORAL | Status: DC

## 2013-08-05 NOTE — Telephone Encounter (Signed)
Sent in. Follow up mid December.

## 2013-08-05 NOTE — Telephone Encounter (Signed)
Spoke with pt, he needs a refill on his Clonidine. I reminded pt he would need to recheck in mid December. He states he will follow but he is out of his medicine. Can we refill until then?

## 2013-08-06 ENCOUNTER — Telehealth: Payer: Self-pay

## 2013-08-06 NOTE — Telephone Encounter (Signed)
Patient is calling us back

## 2013-08-06 NOTE — Telephone Encounter (Signed)
[facility] Rx sent in and to follow up in December.

## 2013-08-09 ENCOUNTER — Emergency Department (HOSPITAL_COMMUNITY): Payer: BC Managed Care – PPO

## 2013-08-09 ENCOUNTER — Encounter (HOSPITAL_COMMUNITY): Payer: Self-pay | Admitting: Emergency Medicine

## 2013-08-09 ENCOUNTER — Emergency Department (HOSPITAL_COMMUNITY)
Admission: EM | Admit: 2013-08-09 | Discharge: 2013-08-09 | Disposition: A | Discharge status: Home / Self Care | Payer: BC Managed Care – PPO | Attending: Emergency Medicine | Admitting: Emergency Medicine

## 2013-08-09 DIAGNOSIS — R739 Hyperglycemia, unspecified: Secondary | ICD-10-CM

## 2013-08-09 DIAGNOSIS — N289 Disorder of kidney and ureter, unspecified: Secondary | ICD-10-CM | POA: Insufficient documentation

## 2013-08-09 DIAGNOSIS — R059 Cough, unspecified: Secondary | ICD-10-CM | POA: Insufficient documentation

## 2013-08-09 DIAGNOSIS — R1110 Vomiting, unspecified: Secondary | ICD-10-CM | POA: Insufficient documentation

## 2013-08-09 DIAGNOSIS — G609 Hereditary and idiopathic neuropathy, unspecified: Secondary | ICD-10-CM | POA: Insufficient documentation

## 2013-08-09 DIAGNOSIS — L299 Pruritus, unspecified: Secondary | ICD-10-CM | POA: Insufficient documentation

## 2013-08-09 DIAGNOSIS — G4700 Insomnia, unspecified: Secondary | ICD-10-CM | POA: Insufficient documentation

## 2013-08-09 DIAGNOSIS — R509 Fever, unspecified: Secondary | ICD-10-CM | POA: Insufficient documentation

## 2013-08-09 DIAGNOSIS — G629 Polyneuropathy, unspecified: Secondary | ICD-10-CM

## 2013-08-09 DIAGNOSIS — R05 Cough: Secondary | ICD-10-CM | POA: Insufficient documentation

## 2013-08-09 DIAGNOSIS — R000 Tachycardia, unspecified: Secondary | ICD-10-CM | POA: Insufficient documentation

## 2013-08-09 DIAGNOSIS — Z794 Long term (current) use of insulin: Secondary | ICD-10-CM | POA: Insufficient documentation

## 2013-08-09 DIAGNOSIS — I10 Essential (primary) hypertension: Secondary | ICD-10-CM | POA: Insufficient documentation

## 2013-08-09 DIAGNOSIS — Z79899 Other long term (current) drug therapy: Secondary | ICD-10-CM | POA: Insufficient documentation

## 2013-08-09 DIAGNOSIS — E1149 Type 2 diabetes mellitus with other diabetic neurological complication: Secondary | ICD-10-CM | POA: Insufficient documentation

## 2013-08-09 DIAGNOSIS — J3489 Other specified disorders of nose and nasal sinuses: Secondary | ICD-10-CM | POA: Insufficient documentation

## 2013-08-09 DIAGNOSIS — G4730 Sleep apnea, unspecified: Secondary | ICD-10-CM | POA: Insufficient documentation

## 2013-08-09 DIAGNOSIS — F411 Generalized anxiety disorder: Secondary | ICD-10-CM | POA: Insufficient documentation

## 2013-08-09 HISTORY — DX: Sleep apnea, unspecified: G47.30

## 2013-08-09 LAB — COMPREHENSIVE METABOLIC PANEL
ALT: 30 U/L (ref 0–53)
AST: 25 U/L (ref 0–37)
Alkaline Phosphatase: 133 U/L — ABNORMAL HIGH (ref 39–117)
CO2: 29 mEq/L (ref 19–32)
Calcium: 10 mg/dL (ref 8.4–10.5)
GFR calc Af Amer: 57 mL/min — ABNORMAL LOW (ref >90)
GFR calc non Af Amer: 49 mL/min — ABNORMAL LOW (ref >90)
Glucose, Bld: 464 mg/dL — ABNORMAL HIGH (ref 70–99)
Potassium: 3.4 mEq/L — ABNORMAL LOW (ref 3.5–5.1)
Sodium: 135 mEq/L (ref 135–145)
Total Protein: 8.5 g/dL — ABNORMAL HIGH (ref 6.0–8.3)

## 2013-08-09 LAB — CBC
Hemoglobin: 17 g/dL (ref 13.0–17.0)
MCHC: 35 g/dL (ref 30.0–36.0)
Platelets: 218 10*3/uL (ref 150–400)
RBC: 5.91 MIL/uL — ABNORMAL HIGH (ref 4.22–5.81)
WBC: 9.8 10*3/uL (ref 4.0–10.5)

## 2013-08-09 LAB — URINE MICROSCOPIC-ADD ON

## 2013-08-09 LAB — URINALYSIS, ROUTINE W REFLEX MICROSCOPIC
Bilirubin Urine: NEGATIVE
Glucose, UA: >1000 mg/dL — AB
Hgb urine dipstick: NEGATIVE
Ketones, ur: NEGATIVE mg/dL
Leukocytes, UA: NEGATIVE
Nitrite: NEGATIVE
Specific Gravity, Urine: 1.027 (ref 1.005–1.030)
pH: 5.5 (ref 5.0–8.0)

## 2013-08-09 LAB — GLUCOSE, CAPILLARY
Glucose-Capillary: 357 mg/dL — ABNORMAL HIGH (ref 70–99)
Glucose-Capillary: 370 mg/dL — ABNORMAL HIGH (ref 70–99)
Glucose-Capillary: 348 mg/dL — ABNORMAL HIGH (ref 70–99)

## 2013-08-09 MED ORDER — SODIUM CHLORIDE 0.9 % IV BOLUS (SEPSIS)
1000.0000 mL | Freq: Once | INTRAVENOUS | Status: AC
  Administered 2013-08-09: 1000 mL via INTRAVENOUS

## 2013-08-09 MED ORDER — HYDROXYZINE HCL 25 MG PO TABS: 25.0000 mg | ORAL_TABLET | Freq: Four times a day (QID) | ORAL | Status: DC

## 2013-08-09 MED ORDER — INSULIN ASPART 100 UNIT/ML ~~LOC~~ SOLN
10.0000 [IU] | Freq: Once | SUBCUTANEOUS | Status: AC
  Administered 2013-08-09: 10 [IU] via SUBCUTANEOUS
  Filled 2013-08-09: qty 1

## 2013-08-09 MED ORDER — ONDANSETRON HCL 4 MG/2ML IJ SOLN
4.0000 mg | Freq: Once | INTRAMUSCULAR | Status: AC
  Administered 2013-08-09: 4 mg via INTRAVENOUS
  Filled 2013-08-09: qty 2

## 2013-08-09 MED ORDER — DIPHENHYDRAMINE HCL 50 MG/ML IJ SOLN
25.0000 mg | Freq: Once | INTRAMUSCULAR | Status: AC
  Administered 2013-08-09: 25 mg via INTRAVENOUS
  Filled 2013-08-09: qty 1

## 2013-08-09 MED ORDER — MORPHINE SULFATE 4 MG/ML IJ SOLN
4.0000 mg | Freq: Once | INTRAMUSCULAR | Status: AC
  Administered 2013-08-09: 4 mg via INTRAVENOUS
  Filled 2013-08-09: qty 1

## 2013-08-09 MED ORDER — INSULIN ASPART 100 UNIT/ML ~~LOC~~ SOLN
10.0000 [IU] | Freq: Once | SUBCUTANEOUS | Status: AC
  Administered 2013-08-09: 10 [IU] via INTRAVENOUS
  Filled 2013-08-09: qty 1

## 2013-08-09 NOTE — ED Notes (Signed)
Pt c/o not sleeping and itching all over body. Arrived by EMS and CBG >600. Denies rashes and pain. Pt stated that he has not been able to sleep in 5 days.

## 2013-08-09 NOTE — ED Notes (Signed)
Provided pt with a cup of water. Okayed by Dr. Karma Ganja
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
## 2013-08-09 NOTE — ED Notes (Signed)
Made Dr. Karma Ganja aware of CBG 348 and HR at 108. Dr. Karma Ganja stated that complete bolus and recheck.

## 2013-08-28 ENCOUNTER — Telehealth: Payer: Self-pay

## 2013-08-28 ENCOUNTER — Other Ambulatory Visit: Payer: Self-pay | Admitting: Physician Assistant

## 2013-08-28 NOTE — Telephone Encounter (Signed)
Pt would like a refill on gabapentin. Best# [phone]

## 2013-08-29 NOTE — Telephone Encounter (Signed)
pts wife states patient is in [place] on jury duty. She will be going there today to take him his med He needs a refill on gabapentin and clodidine

## 2013-08-29 NOTE — Telephone Encounter (Signed)
Patient is checking on status of clonodine and gabapentin. He says it's ridiculous that he has to wait 5 days for his medication and if it's not done he is going to another doctor (I only see a message on the 26th). When I told him that it wasn't ready yet he hung up so I'm not sure if he still wants it or if he is going somewhere else.

## 2013-08-30 ENCOUNTER — Emergency Department (HOSPITAL_COMMUNITY)
Admission: EM | Admit: 2013-08-30 | Discharge: 2013-08-30 | Disposition: A | Discharge status: Home / Self Care | Payer: BC Managed Care – PPO | Attending: Emergency Medicine | Admitting: Emergency Medicine

## 2013-08-30 ENCOUNTER — Encounter (HOSPITAL_COMMUNITY): Payer: Self-pay | Admitting: Emergency Medicine

## 2013-08-30 DIAGNOSIS — I10 Essential (primary) hypertension: Secondary | ICD-10-CM | POA: Insufficient documentation

## 2013-08-30 DIAGNOSIS — IMO0001 Reserved for inherently not codable concepts without codable children: Secondary | ICD-10-CM | POA: Insufficient documentation

## 2013-08-30 DIAGNOSIS — E119 Type 2 diabetes mellitus without complications: Secondary | ICD-10-CM | POA: Insufficient documentation

## 2013-08-30 DIAGNOSIS — R12 Heartburn: Secondary | ICD-10-CM

## 2013-08-30 DIAGNOSIS — M79604 Pain in right leg: Secondary | ICD-10-CM

## 2013-08-30 DIAGNOSIS — Z79899 Other long term (current) drug therapy: Secondary | ICD-10-CM | POA: Insufficient documentation

## 2013-08-30 DIAGNOSIS — Z760 Encounter for issue of repeat prescription: Secondary | ICD-10-CM | POA: Insufficient documentation

## 2013-08-30 DIAGNOSIS — L299 Pruritus, unspecified: Secondary | ICD-10-CM

## 2013-08-30 DIAGNOSIS — R1110 Vomiting, unspecified: Secondary | ICD-10-CM

## 2013-08-30 DIAGNOSIS — E7800 Pure hypercholesterolemia, unspecified: Secondary | ICD-10-CM

## 2013-08-30 DIAGNOSIS — F411 Generalized anxiety disorder: Secondary | ICD-10-CM | POA: Insufficient documentation

## 2013-08-30 DIAGNOSIS — Z794 Long term (current) use of insulin: Secondary | ICD-10-CM | POA: Insufficient documentation

## 2013-08-30 DIAGNOSIS — M79605 Pain in left leg: Secondary | ICD-10-CM

## 2013-08-30 LAB — URINALYSIS, ROUTINE W REFLEX MICROSCOPIC
Bilirubin Urine: NEGATIVE
Glucose, UA: >1000 mg/dL — AB
Hgb urine dipstick: NEGATIVE
Ketones, ur: NEGATIVE mg/dL
Leukocytes, UA: NEGATIVE
Nitrite: NEGATIVE
Protein, ur: 100 mg/dL — AB
Specific Gravity, Urine: 1.026 (ref 1.005–1.030)
Urobilinogen, UA: 0.2 mg/dL (ref 0.0–1.0)
pH: 5.5 (ref 5.0–8.0)

## 2013-08-30 LAB — COMPREHENSIVE METABOLIC PANEL
ALT: 25 U/L (ref 0–53)
AST: 21 U/L (ref 0–37)
Albumin: 4.2 g/dL (ref 3.5–5.2)
Alkaline Phosphatase: 103 U/L (ref 39–117)
BUN: 20 mg/dL (ref 6–23)
CO2: 26 mEq/L (ref 19–32)
Calcium: 10.3 mg/dL (ref 8.4–10.5)
Chloride: 91 mEq/L — ABNORMAL LOW (ref 96–112)
Creatinine, Ser: 1.79 mg/dL — ABNORMAL HIGH (ref 0.50–1.35)
GFR calc Af Amer: 51 mL/min — ABNORMAL LOW (ref >90)
GFR calc non Af Amer: 44 mL/min — ABNORMAL LOW (ref >90)
Glucose, Bld: 412 mg/dL — ABNORMAL HIGH (ref 70–99)
Potassium: 3 mEq/L — ABNORMAL LOW (ref 3.5–5.1)
Sodium: 134 mEq/L — ABNORMAL LOW (ref 135–145)
Total Bilirubin: 0.5 mg/dL (ref 0.3–1.2)
Total Protein: 8.8 g/dL — ABNORMAL HIGH (ref 6.0–8.3)

## 2013-08-30 LAB — CBC WITH DIFFERENTIAL/PLATELET
Basophils Absolute: 0 10*3/uL (ref 0.0–0.1)
Basophils Relative: 0 % (ref 0–1)
Eosinophils Absolute: 0.2 10*3/uL (ref 0.0–0.7)
Eosinophils Relative: 2 % (ref 0–5)
HCT: 48.8 % (ref 39.0–52.0)
Hemoglobin: 17.1 g/dL — ABNORMAL HIGH (ref 13.0–17.0)
Lymphocytes Relative: 26 % (ref 12–46)
Lymphs Abs: 2.8 10*3/uL (ref 0.7–4.0)
MCH: 28.5 pg (ref 26.0–34.0)
MCHC: 35 g/dL (ref 30.0–36.0)
MCV: 81.2 fL (ref 78.0–100.0)
Monocytes Absolute: 0.7 10*3/uL (ref 0.1–1.0)
Monocytes Relative: 7 % (ref 3–12)
Neutro Abs: 7.2 10*3/uL (ref 1.7–7.7)
Neutrophils Relative %: 66 % (ref 43–77)
Platelets: 262 10*3/uL (ref 150–400)
RBC: 6.01 MIL/uL — ABNORMAL HIGH (ref 4.22–5.81)
RDW: 14.9 % (ref 11.5–15.5)
WBC: 11 10*3/uL — ABNORMAL HIGH (ref 4.0–10.5)

## 2013-08-30 LAB — URINE MICROSCOPIC-ADD ON

## 2013-08-30 MED ORDER — SODIUM CHLORIDE 0.9 % IV BOLUS (SEPSIS)
1000.0000 mL | Freq: Once | INTRAVENOUS | Status: AC
  Administered 2013-08-30: 1000 mL via INTRAVENOUS

## 2013-08-30 MED ORDER — GABAPENTIN 400 MG PO CAPS: 400.0000 mg | ORAL_CAPSULE | Freq: Two times a day (BID) | ORAL | Status: AC

## 2013-08-30 MED ORDER — ESZOPICLONE 3 MG PO TABS: 3.0000 mg | ORAL_TABLET | Freq: Every day | ORAL | Status: AC

## 2013-08-30 MED ORDER — SITAGLIPTIN PHOSPHATE 100 MG PO TABS: 100.0000 mg | ORAL_TABLET | Freq: Every day | ORAL | Status: AC

## 2013-08-30 MED ORDER — OMEPRAZOLE 40 MG PO CPDR: 40.0000 mg | DELAYED_RELEASE_CAPSULE | Freq: Every day | ORAL | Status: AC

## 2013-08-30 MED ORDER — HYDROCODONE-ACETAMINOPHEN 5-325 MG PO TABS: 1.0000 | ORAL_TABLET | Freq: Four times a day (QID) | ORAL | Status: AC | PRN

## 2013-08-30 MED ORDER — HYDROCHLOROTHIAZIDE 25 MG PO TABS: 25.0000 mg | ORAL_TABLET | Freq: Every day | ORAL | Status: AC

## 2013-08-30 MED ORDER — AMLODIPINE BESYLATE 10 MG PO TABS: 10.0000 mg | ORAL_TABLET | Freq: Every day | ORAL | Status: AC

## 2013-08-30 MED ORDER — HYDROXYZINE HCL 25 MG PO TABS: 25.0000 mg | ORAL_TABLET | Freq: Four times a day (QID) | ORAL | Status: AC

## 2013-08-30 MED ORDER — INSULIN GLARGINE 100 UNIT/ML SOLOSTAR PEN: 60.0000 [IU] | PEN_INJECTOR | Freq: Two times a day (BID) | SUBCUTANEOUS | Status: AC

## 2013-08-30 MED ORDER — ATORVASTATIN CALCIUM 20 MG PO TABS: 20.0000 mg | ORAL_TABLET | Freq: Every day | ORAL | Status: DC

## 2013-08-30 MED ORDER — VALACYCLOVIR HCL 1 G PO TABS: 1000.0000 mg | ORAL_TABLET | Freq: Two times a day (BID) | ORAL | Status: AC

## 2013-08-30 NOTE — ED Notes (Signed)
Per pt, states he was at [facility] on the 7 th for the same symptoms-states he is having kidney pain, itching and vomiting X 2 days-only vomited 3 times-states he does not get along with PCP and wants another MD, also want all meds filled

## 2013-08-30 NOTE — ED Provider Notes (Signed)
Medical screening examination/treatment/procedure(s) were conducted as a shared visit with non-physician practitioner(s) and myself.  I personally evaluated the patient during the encounter.  EKG Interpretation   None       Well appearing. Eating subway. No complaints. Medications refilled  Lyanne Co, MD 08/30/13 1500
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
## 2013-08-31 NOTE — Telephone Encounter (Signed)
We refilled his Clonidine on 12/3.  Pt needs an OV before any more refills.

## 2013-08-31 NOTE — Telephone Encounter (Signed)
Called patient to advise he needs follow up. We need to review his dosage of the Gabapentin.

## 2013-09-01 NOTE — ED Notes (Signed)
Patient calling for information on Rxs given.

## 2013-09-03 ENCOUNTER — Emergency Department (HOSPITAL_COMMUNITY): Payer: BC Managed Care – PPO

## 2013-09-03 ENCOUNTER — Inpatient Hospital Stay (HOSPITAL_COMMUNITY)
Admission: EM | Admit: 2013-09-03 | Discharge: 2013-09-05 | DRG: 287 | Disposition: A | Discharge status: Home / Self Care | Payer: BC Managed Care – PPO | Attending: Cardiology | Admitting: Cardiology

## 2013-09-03 ENCOUNTER — Encounter (HOSPITAL_COMMUNITY): Payer: Self-pay | Admitting: Emergency Medicine

## 2013-09-03 DIAGNOSIS — N182 Chronic kidney disease, stage 2 (mild): Secondary | ICD-10-CM | POA: Diagnosis present

## 2013-09-03 DIAGNOSIS — I119 Hypertensive heart disease without heart failure: Secondary | ICD-10-CM | POA: Diagnosis present

## 2013-09-03 DIAGNOSIS — I1310 Hypertensive heart and chronic kidney disease without heart failure, with stage 1 through stage 4 chronic kidney disease, or unspecified chronic kidney disease: Principal | ICD-10-CM | POA: Diagnosis present

## 2013-09-03 DIAGNOSIS — Z6841 Body Mass Index (BMI) 40.0 and over, adult: Secondary | ICD-10-CM

## 2013-09-03 DIAGNOSIS — G4733 Obstructive sleep apnea (adult) (pediatric): Secondary | ICD-10-CM | POA: Diagnosis present

## 2013-09-03 DIAGNOSIS — I10 Essential (primary) hypertension: Secondary | ICD-10-CM

## 2013-09-03 DIAGNOSIS — I252 Old myocardial infarction: Secondary | ICD-10-CM

## 2013-09-03 DIAGNOSIS — IMO0002 Reserved for concepts with insufficient information to code with codable children: Secondary | ICD-10-CM

## 2013-09-03 DIAGNOSIS — Z7982 Long term (current) use of aspirin: Secondary | ICD-10-CM

## 2013-09-03 DIAGNOSIS — I2510 Atherosclerotic heart disease of native coronary artery without angina pectoris: Secondary | ICD-10-CM | POA: Diagnosis present

## 2013-09-03 DIAGNOSIS — R739 Hyperglycemia, unspecified: Secondary | ICD-10-CM

## 2013-09-03 DIAGNOSIS — I249 Acute ischemic heart disease, unspecified: Secondary | ICD-10-CM

## 2013-09-03 DIAGNOSIS — R079 Chest pain, unspecified: Secondary | ICD-10-CM | POA: Diagnosis present

## 2013-09-03 DIAGNOSIS — I214 Non-ST elevation (NSTEMI) myocardial infarction: Secondary | ICD-10-CM

## 2013-09-03 DIAGNOSIS — E1165 Type 2 diabetes mellitus with hyperglycemia: Secondary | ICD-10-CM

## 2013-09-03 DIAGNOSIS — F411 Generalized anxiety disorder: Secondary | ICD-10-CM | POA: Diagnosis present

## 2013-09-03 DIAGNOSIS — N183 Chronic kidney disease, stage 3 unspecified: Secondary | ICD-10-CM

## 2013-09-03 DIAGNOSIS — G4730 Sleep apnea, unspecified: Secondary | ICD-10-CM

## 2013-09-03 DIAGNOSIS — Z833 Family history of diabetes mellitus: Secondary | ICD-10-CM

## 2013-09-03 DIAGNOSIS — E781 Pure hyperglyceridemia: Secondary | ICD-10-CM | POA: Diagnosis present

## 2013-09-03 DIAGNOSIS — Z794 Long term (current) use of insulin: Secondary | ICD-10-CM

## 2013-09-03 DIAGNOSIS — IMO0001 Reserved for inherently not codable concepts without codable children: Secondary | ICD-10-CM | POA: Diagnosis present

## 2013-09-03 DIAGNOSIS — I200 Unstable angina: Secondary | ICD-10-CM

## 2013-09-03 DIAGNOSIS — Z79899 Other long term (current) drug therapy: Secondary | ICD-10-CM

## 2013-09-03 DIAGNOSIS — E7800 Pure hypercholesterolemia, unspecified: Secondary | ICD-10-CM

## 2013-09-03 HISTORY — DX: Hypertensive heart disease without heart failure: I11.9

## 2013-09-03 HISTORY — DX: Chronic kidney disease, stage 2 (mild): N18.2

## 2013-09-03 HISTORY — DX: Morbid (severe) obesity due to excess calories: E66.01

## 2013-09-03 HISTORY — DX: Type 2 diabetes mellitus without complications: E11.9

## 2013-09-03 HISTORY — DX: Atherosclerotic heart disease of native coronary artery without angina pectoris: I25.10

## 2013-09-03 HISTORY — DX: Essential (primary) hypertension: I10

## 2013-09-03 HISTORY — DX: Old myocardial infarction: I25.2

## 2013-09-03 LAB — URINALYSIS, ROUTINE W REFLEX MICROSCOPIC
BILIRUBIN URINE: NEGATIVE
Glucose, UA: >1000 mg/dL — AB
Hgb urine dipstick: NEGATIVE
Ketones, ur: NEGATIVE mg/dL
Leukocytes, UA: NEGATIVE
NITRITE: NEGATIVE
Protein, ur: NEGATIVE mg/dL
Specific Gravity, Urine: 1.027 (ref 1.005–1.030)
UROBILINOGEN UA: 1 mg/dL (ref 0.0–1.0)
pH: 6.5 (ref 5.0–8.0)

## 2013-09-03 LAB — GLUCOSE, CAPILLARY
GLUCOSE-CAPILLARY: 337 mg/dL — AB (ref 70–99)
GLUCOSE-CAPILLARY: 277 mg/dL — AB (ref 70–99)
Glucose-Capillary: 485 mg/dL — ABNORMAL HIGH (ref 70–99)

## 2013-09-03 LAB — CBC WITH DIFFERENTIAL/PLATELET
Basophils Absolute: 0 10*3/uL (ref 0.0–0.1)
Basophils Relative: 0 % (ref 0–1)
EOS ABS: 0.2 10*3/uL (ref 0.0–0.7)
Eosinophils Relative: 4 % (ref 0–5)
HCT: 47.5 % (ref 39.0–52.0)
Hemoglobin: 16.5 g/dL (ref 13.0–17.0)
LYMPHS ABS: 2.2 10*3/uL (ref 0.7–4.0)
Lymphocytes Relative: 36 % (ref 12–46)
MCH: 29 pg (ref 26.0–34.0)
MCHC: 34.7 g/dL (ref 30.0–36.0)
MCV: 83.6 fL (ref 78.0–100.0)
MONOS PCT: 9 % (ref 3–12)
Monocytes Absolute: 0.5 10*3/uL (ref 0.1–1.0)
Neutro Abs: 3.2 10*3/uL (ref 1.7–7.7)
Neutrophils Relative %: 52 % (ref 43–77)
Platelets: 223 10*3/uL (ref 150–400)
RBC: 5.68 MIL/uL (ref 4.22–5.81)
RDW: 14.8 % (ref 11.5–15.5)
WBC: 6.1 10*3/uL (ref 4.0–10.5)

## 2013-09-03 LAB — PROTIME-INR
INR: 1.04 (ref 0.00–1.49)
Prothrombin Time: 13.4 seconds (ref 11.6–15.2)

## 2013-09-03 LAB — COMPREHENSIVE METABOLIC PANEL
ALT: 44 U/L (ref 0–53)
AST: 30 U/L (ref 0–37)
Albumin: 3.9 g/dL (ref 3.5–5.2)
Alkaline Phosphatase: 99 U/L (ref 39–117)
BUN: 14 mg/dL (ref 6–23)
CALCIUM: 9.5 mg/dL (ref 8.4–10.5)
CO2: 29 mEq/L (ref 19–32)
CREATININE: 1.46 mg/dL — AB (ref 0.50–1.35)
Chloride: 91 mEq/L — ABNORMAL LOW (ref 96–112)
GFR calc non Af Amer: 56 mL/min — ABNORMAL LOW (ref >90)
GFR, EST AFRICAN AMERICAN: 65 mL/min — AB (ref >90)
Glucose, Bld: 505 mg/dL — ABNORMAL HIGH (ref 70–99)
Potassium: 3.8 mEq/L (ref 3.7–5.3)
Sodium: 135 mEq/L — ABNORMAL LOW (ref 137–147)
Total Bilirubin: 0.5 mg/dL (ref 0.3–1.2)
Total Protein: 8.1 g/dL (ref 6.0–8.3)

## 2013-09-03 LAB — POCT I-STAT TROPONIN I
Troponin i, poc: 0.04 ng/mL (ref 0.00–0.08)
Troponin i, poc: 0.09 ng/mL (ref 0.00–0.08)

## 2013-09-03 LAB — URINE MICROSCOPIC-ADD ON

## 2013-09-03 LAB — APTT: aPTT: 75 s — ABNORMAL HIGH (ref 24–37)

## 2013-09-03 LAB — HEPARIN LEVEL (UNFRACTIONATED): Heparin Unfractionated: 0.11 IU/mL — ABNORMAL LOW (ref 0.30–0.70)

## 2013-09-03 LAB — TROPONIN I: Troponin I: <0.3 ng/mL (ref <0.30)

## 2013-09-03 LAB — LIPASE, BLOOD: LIPASE: 56 U/L (ref 11–59)

## 2013-09-03 MED ORDER — ASPIRIN 81 MG PO CHEW
324.0000 mg | CHEWABLE_TABLET | ORAL | Status: AC
  Administered 2013-09-03: 324 mg via ORAL
  Filled 2013-09-03: qty 4

## 2013-09-03 MED ORDER — ASPIRIN EC 81 MG PO TBEC
81.0000 mg | DELAYED_RELEASE_TABLET | Freq: Every day | ORAL | Status: DC
  Administered 2013-09-05: 09:00:00 81 mg via ORAL
  Filled 2013-09-03: qty 1

## 2013-09-03 MED ORDER — NITROGLYCERIN IN D5W 200-5 MCG/ML-% IV SOLN
2.0000 ug/min | INTRAVENOUS | Status: DC
  Administered 2013-09-03: 5 ug/min via INTRAVENOUS
  Filled 2013-09-03: qty 250

## 2013-09-03 MED ORDER — ASPIRIN EC 81 MG PO TBEC: 81.0000 mg | DELAYED_RELEASE_TABLET | Freq: Every day | ORAL | Status: DC

## 2013-09-03 MED ORDER — ASPIRIN 300 MG RE SUPP: 300.0000 mg | RECTAL | Status: AC

## 2013-09-03 MED ORDER — ONDANSETRON HCL 4 MG/2ML IJ SOLN: 4.0000 mg | Freq: Four times a day (QID) | INTRAMUSCULAR | Status: DC | PRN

## 2013-09-03 MED ORDER — SODIUM CHLORIDE 0.9 % IV SOLN: INTRAVENOUS | Status: DC

## 2013-09-03 MED ORDER — METOPROLOL TARTRATE 25 MG PO TABS
25.0000 mg | ORAL_TABLET | Freq: Two times a day (BID) | ORAL | Status: DC
  Administered 2013-09-03: 25 mg via ORAL
  Filled 2013-09-03 (×4): qty 1

## 2013-09-03 MED ORDER — SODIUM CHLORIDE 0.9 % IJ SOLN: 3.0000 mL | Freq: Two times a day (BID) | INTRAMUSCULAR | Status: DC

## 2013-09-03 MED ORDER — PANTOPRAZOLE SODIUM 40 MG PO TBEC
40.0000 mg | DELAYED_RELEASE_TABLET | Freq: Every day | ORAL | Status: DC
  Administered 2013-09-04 – 2013-09-05 (×2): 40 mg via ORAL
  Filled 2013-09-03: qty 1

## 2013-09-03 MED ORDER — MORPHINE SULFATE 4 MG/ML IJ SOLN
4.0000 mg | Freq: Once | INTRAMUSCULAR | Status: AC
  Administered 2013-09-03: 4 mg via INTRAVENOUS
  Filled 2013-09-03: qty 1

## 2013-09-03 MED ORDER — SODIUM CHLORIDE 0.9 % IV SOLN: 250.0000 mL | INTRAVENOUS | Status: DC | PRN

## 2013-09-03 MED ORDER — INSULIN ASPART 100 UNIT/ML ~~LOC~~ SOLN
0.0000 [IU] | Freq: Three times a day (TID) | SUBCUTANEOUS | Status: DC
  Administered 2013-09-04: 15 [IU] via SUBCUTANEOUS
  Administered 2013-09-04: 11 [IU] via SUBCUTANEOUS
  Administered 2013-09-05: 5 [IU] via SUBCUTANEOUS

## 2013-09-03 MED ORDER — LOSARTAN POTASSIUM 50 MG PO TABS
50.0000 mg | ORAL_TABLET | Freq: Every day | ORAL | Status: DC
  Administered 2013-09-03 – 2013-09-05 (×3): 50 mg via ORAL
  Filled 2013-09-03 (×4): qty 1

## 2013-09-03 MED ORDER — SODIUM CHLORIDE 0.9 % IV BOLUS (SEPSIS)
1000.0000 mL | Freq: Once | INTRAVENOUS | Status: AC
  Administered 2013-09-03: 1000 mL via INTRAVENOUS

## 2013-09-03 MED ORDER — HEPARIN BOLUS VIA INFUSION
4000.0000 [IU] | Freq: Once | INTRAVENOUS | Status: AC
  Administered 2013-09-03: 4000 [IU] via INTRAVENOUS
  Filled 2013-09-03: qty 4000

## 2013-09-03 MED ORDER — ATORVASTATIN CALCIUM 20 MG PO TABS
20.0000 mg | ORAL_TABLET | Freq: Every day | ORAL | Status: DC
  Filled 2013-09-03 (×2): qty 1

## 2013-09-03 MED ORDER — ACETAMINOPHEN 325 MG PO TABS: 650.0000 mg | ORAL_TABLET | ORAL | Status: DC | PRN

## 2013-09-03 MED ORDER — ADULT MULTIVITAMIN W/MINERALS CH
1.0000 | ORAL_TABLET | Freq: Every day | ORAL | Status: DC
  Administered 2013-09-04 – 2013-09-05 (×2): 1 via ORAL
  Filled 2013-09-03 (×3): qty 1

## 2013-09-03 MED ORDER — ONDANSETRON HCL 4 MG/2ML IJ SOLN
4.0000 mg | Freq: Once | INTRAMUSCULAR | Status: AC
  Administered 2013-09-03: 4 mg via INTRAVENOUS
  Filled 2013-09-03: qty 2

## 2013-09-03 MED ORDER — HEPARIN (PORCINE) IN NACL 100-0.45 UNIT/ML-% IJ SOLN
1650.0000 [IU]/h | INTRAMUSCULAR | Status: DC
  Administered 2013-09-03: 1250 [IU]/h via INTRAVENOUS
  Administered 2013-09-04: 1650 [IU]/h via INTRAVENOUS
  Filled 2013-09-03 (×3): qty 250

## 2013-09-03 MED ORDER — SODIUM CHLORIDE 0.9 % IJ SOLN
3.0000 mL | INTRAMUSCULAR | Status: DC | PRN
Start: 2013-09-03 — End: 2013-09-04

## 2013-09-03 MED ORDER — NITROGLYCERIN 0.4 MG SL SUBL: 0.4000 mg | SUBLINGUAL_TABLET | SUBLINGUAL | Status: DC | PRN

## 2013-09-03 MED ORDER — ASPIRIN 81 MG PO CHEW
81.0000 mg | CHEWABLE_TABLET | ORAL | Status: AC
  Administered 2013-09-04: 81 mg via ORAL
  Filled 2013-09-03: qty 1

## 2013-09-03 MED ORDER — AMLODIPINE BESYLATE 10 MG PO TABS
10.0000 mg | ORAL_TABLET | Freq: Every day | ORAL | Status: DC
  Administered 2013-09-03 – 2013-09-05 (×3): 10 mg via ORAL
  Filled 2013-09-03 (×3): qty 1

## 2013-09-03 MED ORDER — GABAPENTIN 400 MG PO CAPS
400.0000 mg | ORAL_CAPSULE | Freq: Two times a day (BID) | ORAL | Status: DC
  Administered 2013-09-03 – 2013-09-05 (×4): 400 mg via ORAL
  Filled 2013-09-03 (×5): qty 1

## 2013-09-03 NOTE — Progress Notes (Signed)
Inpatient Diabetes Program Recommendations  AACE/ADA: New Consensus Statement on Inpatient Glycemic Control (2013)  Target Ranges:  Prepandial:   less than 140 mg/dL      Peak postprandial:   less than 180 mg/dL (1-2 hours)      Critically ill patients:  140 - 180 mg/dL   Reason for Assessment: Consult for diabetes management  48 year old male presenting with severe CP.  Hx poorly controlled Type 2 DM on insulin. Home meds:  Lantus 60 units bid and Januvia 100mg /day  Inpatient Diabetes Program Recommendations Insulin - IV drip/GlucoStabilizer: Begin IV Insulin/GlucoStabilizer order set for Hyperglycemia HgbA1C: Need updated HgbA1C to assess glycemic control prior to admission. Last one - 16.9% on 04/26/2013 Diet: When advanced CHO mod med  Note: IV insulin reduces time to stabilize glucose since pt will likely have cath tomorrow.  Will continue to follow. Thank you. Lorenda Peck, RD, LDN, CDE Inpatient Diabetes Coordinator [phone]

## 2013-09-03 NOTE — ED Notes (Signed)
i-STAT cTnl of 0.09 was shown to [employer].
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
## 2013-09-03 NOTE — ED Notes (Signed)
CBG was 337.. RN notified of results

## 2013-09-03 NOTE — ED Notes (Signed)
Meal tray ordered 
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
## 2013-09-03 NOTE — ED Notes (Signed)
Pt generalized abdominal pain with n/v onset yesterday. Pt reports being around a friend that has been sick recently.

## 2013-09-03 NOTE — ED Notes (Signed)
Phlebotomy notified of blood needing to be drawn.  This RN attempted to draw blood unsuccessfully.

## 2013-09-03 NOTE — ED Notes (Signed)
cbg 485

## 2013-09-04 ENCOUNTER — Encounter (HOSPITAL_COMMUNITY)
Admission: EM | Disposition: A | Discharge status: Home / Self Care | Payer: Self-pay | Source: Home / Self Care | Attending: Cardiology

## 2013-09-04 DIAGNOSIS — R079 Chest pain, unspecified: Secondary | ICD-10-CM

## 2013-09-04 DIAGNOSIS — I517 Cardiomegaly: Secondary | ICD-10-CM

## 2013-09-04 HISTORY — PX: LEFT HEART CATHETERIZATION WITH CORONARY ANGIOGRAM: SHX5451

## 2013-09-04 LAB — HEMOGLOBIN A1C
HEMOGLOBIN A1C: 15.3 % — AB (ref <5.7)
Mean Plasma Glucose: 392 mg/dL — ABNORMAL HIGH (ref <117)

## 2013-09-04 LAB — CBC
HCT: 46.8 % (ref 39.0–52.0)
Hemoglobin: 16 g/dL (ref 13.0–17.0)
MCH: 28.8 pg (ref 26.0–34.0)
MCHC: 34.2 g/dL (ref 30.0–36.0)
MCV: 84.2 fL (ref 78.0–100.0)
PLATELETS: 193 10*3/uL (ref 150–400)
RBC: 5.56 MIL/uL (ref 4.22–5.81)
RDW: 14.9 % (ref 11.5–15.5)
WBC: 6.9 10*3/uL (ref 4.0–10.5)

## 2013-09-04 LAB — GLUCOSE, CAPILLARY
GLUCOSE-CAPILLARY: 210 mg/dL — AB (ref 70–99)
Glucose-Capillary: 313 mg/dL — ABNORMAL HIGH (ref 70–99)
Glucose-Capillary: 366 mg/dL — ABNORMAL HIGH (ref 70–99)
Glucose-Capillary: 300 mg/dL — ABNORMAL HIGH (ref 70–99)

## 2013-09-04 LAB — LIPID PANEL
CHOL/HDL RATIO: 7 ratio
Cholesterol: 197 mg/dL (ref 0–200)
HDL: 28 mg/dL — ABNORMAL LOW (ref >39)
LDL CALC: 126 mg/dL — AB (ref 0–99)
Triglycerides: 216 mg/dL — ABNORMAL HIGH (ref <150)
VLDL: 43 mg/dL — ABNORMAL HIGH (ref 0–40)

## 2013-09-04 LAB — BASIC METABOLIC PANEL WITH GFR
BUN: 11 mg/dL (ref 6–23)
CO2: 30 meq/L (ref 19–32)
Calcium: 9.3 mg/dL (ref 8.4–10.5)
Chloride: 99 meq/L (ref 96–112)
Creatinine, Ser: 1.24 mg/dL (ref 0.50–1.35)
GFR calc Af Amer: 79 mL/min — ABNORMAL LOW
GFR calc non Af Amer: 68 mL/min — ABNORMAL LOW
Glucose, Bld: 289 mg/dL — ABNORMAL HIGH (ref 70–99)
Potassium: 3.4 meq/L — ABNORMAL LOW (ref 3.7–5.3)
Sodium: 142 meq/L (ref 137–147)

## 2013-09-04 LAB — MRSA PCR SCREENING: MRSA by PCR: NEGATIVE

## 2013-09-04 LAB — HEPARIN LEVEL (UNFRACTIONATED): Heparin Unfractionated: 0.5 IU/mL (ref 0.30–0.70)

## 2013-09-04 LAB — TROPONIN I
Troponin I: <0.3 ng/mL
Troponin I: <0.3 ng/mL (ref <0.30)

## 2013-09-04 SURGERY — LEFT HEART CATHETERIZATION WITH CORONARY ANGIOGRAM
Anesthesia: LOCAL

## 2013-09-04 MED ORDER — HYDROCHLOROTHIAZIDE 25 MG PO TABS
25.0000 mg | ORAL_TABLET | Freq: Every day | ORAL | Status: DC
  Administered 2013-09-05: 09:00:00 25 mg via ORAL
  Filled 2013-09-04: qty 1

## 2013-09-04 MED ORDER — INSULIN GLARGINE 100 UNIT/ML ~~LOC~~ SOLN
60.0000 [IU] | Freq: Two times a day (BID) | SUBCUTANEOUS | Status: DC
  Administered 2013-09-04 – 2013-09-05 (×3): 60 [IU] via SUBCUTANEOUS
  Filled 2013-09-04 (×5): qty 0.6

## 2013-09-04 MED ORDER — FENTANYL CITRATE 0.05 MG/ML IJ SOLN
INTRAMUSCULAR | Status: AC
  Filled 2013-09-04: qty 2

## 2013-09-04 MED ORDER — VERAPAMIL HCL 2.5 MG/ML IV SOLN
INTRAVENOUS | Status: AC
  Filled 2013-09-04: qty 2

## 2013-09-04 MED ORDER — CLONIDINE HCL 0.3 MG PO TABS
0.3000 mg | ORAL_TABLET | Freq: Once | ORAL | Status: AC
  Administered 2013-09-04: 0.3 mg via ORAL
  Filled 2013-09-04: qty 1

## 2013-09-04 MED ORDER — LIDOCAINE HCL (PF) 1 % IJ SOLN
INTRAMUSCULAR | Status: AC
  Filled 2013-09-04: qty 30

## 2013-09-04 MED ORDER — HYDROXYZINE HCL 25 MG PO TABS
25.0000 mg | ORAL_TABLET | Freq: Four times a day (QID) | ORAL | Status: DC | PRN
  Filled 2013-09-04: qty 1

## 2013-09-04 MED ORDER — HEPARIN SODIUM (PORCINE) 5000 UNIT/ML IJ SOLN
5000.0000 [IU] | Freq: Three times a day (TID) | INTRAMUSCULAR | Status: DC
  Administered 2013-09-04 – 2013-09-05 (×2): 5000 [IU] via SUBCUTANEOUS
  Filled 2013-09-04 (×5): qty 1

## 2013-09-04 MED ORDER — HEPARIN SODIUM (PORCINE) 1000 UNIT/ML IJ SOLN
INTRAMUSCULAR | Status: AC
  Filled 2013-09-04: qty 1

## 2013-09-04 MED ORDER — SODIUM CHLORIDE 0.9 % IV SOLN
1.0000 mL/kg/h | INTRAVENOUS | Status: AC
  Administered 2013-09-04: 1 mL/kg/h via INTRAVENOUS

## 2013-09-04 MED ORDER — CARVEDILOL 25 MG PO TABS
25.0000 mg | ORAL_TABLET | Freq: Two times a day (BID) | ORAL | Status: DC
  Administered 2013-09-04 – 2013-09-05 (×3): 25 mg via ORAL
  Filled 2013-09-04 (×5): qty 1

## 2013-09-04 MED ORDER — ATORVASTATIN CALCIUM 80 MG PO TABS
80.0000 mg | ORAL_TABLET | Freq: Every day | ORAL | Status: DC
  Administered 2013-09-04 – 2013-09-05 (×2): 80 mg via ORAL
  Filled 2013-09-04 (×2): qty 1

## 2013-09-04 MED ORDER — MIDAZOLAM HCL 2 MG/2ML IJ SOLN
INTRAMUSCULAR | Status: AC
  Filled 2013-09-04: qty 2

## 2013-09-04 MED ORDER — POTASSIUM CHLORIDE CRYS ER 20 MEQ PO TBCR
20.0000 meq | EXTENDED_RELEASE_TABLET | Freq: Once | ORAL | Status: AC
  Administered 2013-09-04: 20 meq via ORAL
  Filled 2013-09-04: qty 1

## 2013-09-04 MED ORDER — HEPARIN (PORCINE) IN NACL 2-0.9 UNIT/ML-% IJ SOLN
INTRAMUSCULAR | Status: AC
  Filled 2013-09-04: qty 1000

## 2013-09-04 MED ORDER — ZOLPIDEM TARTRATE 5 MG PO TABS
5.0000 mg | ORAL_TABLET | Freq: Every evening | ORAL | Status: DC | PRN
  Administered 2013-09-05: 5 mg via ORAL
  Filled 2013-09-04: qty 1

## 2013-09-04 MED ORDER — HEPARIN BOLUS VIA INFUSION
3000.0000 [IU] | Freq: Once | INTRAVENOUS | Status: AC
  Administered 2013-09-04: 3000 [IU] via INTRAVENOUS
  Filled 2013-09-04: qty 3000

## 2013-09-04 NOTE — Progress Notes (Addendum)
 TELEMETRY: Reviewed telemetry pt in NSR: Filed Vitals:   09/04/13 0310 09/04/13 0320 09/04/13 0355 09/04/13 0800  BP:  187/105 193/115   Pulse:  88 101   Temp:  98.3 F (36.8 C)  97.8 F (36.6 C)  TempSrc:  Oral  Oral  Resp:  16 18   Height:      Weight:      SpO2: 75% 97% 97%     Intake/Output Summary (Last 24 hours) at 09/04/13 0945 Last data filed at 09/04/13 0600  Gross per 24 hour  Intake  372.5 ml  Output   1450 ml  Net -1077.5 ml    SUBJECTIVE Patient denies any recurrent chest pain since IV Ntg started. Breathing is OK. Has chronic itching for which he takes atarax. History of poor glycemic and BP control.  LABS: Basic Metabolic Panel:  Recent Labs  09/03/13 1112 09/04/13 0422  NA 135* 142  K 3.8 3.4*  CL 91* 99  CO2 29 30  GLUCOSE 505* 289*  BUN 14 11  CREATININE 1.46* 1.24  CALCIUM 9.5 9.3   Liver Function Tests:  Recent Labs  09/03/13 1112  AST 30  ALT 44  ALKPHOS 99  BILITOT 0.5  PROT 8.1  ALBUMIN 3.9    Recent Labs  09/03/13 1112  LIPASE 56   CBC:  Recent Labs  09/03/13 1112 09/04/13 0422  WBC 6.1 6.9  NEUTROABS 3.2  --   HGB 16.5 16.0  HCT 47.5 46.8  MCV 83.6 84.2  PLT 223 193   Cardiac Enzymes:  Recent Labs  09/03/13 1932 09/04/13 0130 09/04/13 0422  TROPONINI <0.30 <0.30 <0.30   Hemoglobin A1C:  Recent Labs  09/03/13 1932  HGBA1C 15.3*   Fasting Lipid Panel:  Recent Labs  09/04/13 0422  CHOL 197  HDL 28*  LDLCALC 126*  TRIG 216*  CHOLHDL 7.0    Radiology/Studies:  Dg Chest 2 View  09/03/2013   CLINICAL DATA:  Nausea vomiting  EXAM: CHEST  2 VIEW  COMPARISON:  08/09/2013  FINDINGS: The heart size and mediastinal contours are within normal limits. Both lungs are clear. The visualized skeletal structures are unremarkable.  IMPRESSION: No active cardiopulmonary disease.   Electronically Signed   By: Hetal  Patel   On: 09/03/2013 12:30   Dg Chest 2 View  08/09/2013   CLINICAL DATA:  Hyperglycemia,  cough, congestion, fever  EXAM: CHEST  2 VIEW  COMPARISON:  08/11/2008  FINDINGS: Study is markedly limited by poor inspiration. Cardiomegaly is noted. Chronic elevation of the right hemidiaphragm. No acute infiltrate or pulmonary edema. Bilateral basilar atelectasis.  IMPRESSION: Limited study by poor inspiration. Cardiomegaly. Chronic elevation of the right hemidiaphragm. Bilateral basilar atelectasis. No acute infiltrate or pulmonary edema.   Electronically Signed   By: Liviu  Pop M.D.   On: 08/09/2013 09:16   Ecg: NSR, poor R wave progression across the anterior precordium. Lateral infarct changes resolved.   Echo: pending.  PHYSICAL EXAM General: Morbidly obese, well nourished, in no acute distress. Head: Normocephalic, atraumatic, sclera non-icteric, no xanthomas, nares are without discharge. Neck: Negative for carotid bruits. JVD not elevated. Lungs: Clear bilaterally to auscultation without wheezes, rales, or rhonchi. Breathing is unlabored. Heart: RRR S1 S2 without murmurs, rubs, or gallops.  Abdomen: Soft, obese,non-tender, non-distended with normoactive bowel sounds. No hepatomegaly. Msk:  Strength and tone appears normal for age. Extremities: No clubbing, cyanosis or edema.  Distal pedal pulses are 2+ and equal bilaterally. Neuro: Alert and oriented X   3. Moves all extremities spontaneously. Psych:  Responds to questions appropriately with a normal affect.  ASSESSMENT AND PLAN: 1. Unstable angina. Patient with multiple cardiac risk factors presents with classic anginal symptoms at rest relieved with Ntg. Dynamic Ecg changes in lateral leads- now improved. POC troponin 0.09. All other troponins negative. Patient has a high TIMI score. Agree with recommendations for invasive evaluation with cardiac cath +/- PCI. Patient is very anxious about procedure since his mother died following cardiac cath in 2006. I discussed alternative evaluation including cardiac CTA or stress testing. We  discussed benefits and risks associated with each procedure. After thorough discussion he is agreeable to proceed with cardiac cath. The procedure and risks were reviewed including but not limited to death, myocardial infarction, stroke, arrythmias, bleeding, transfusion, emergency surgery, dye allergy, or renal dysfunction. The patient voices understanding and is agreeable to proceed. 2. HTN labile- continue Norvasc, Catapres patch, cozaar. Switch metoprolol to carvedilol 25 mg bid.  3. DM type 2 with poor control. A1c 15.3. On SSI. Will start back on Glargine insulin 60 units bid. 4. CKD stage 2. Creatinine improved this am. Will try and limit dye load with cardiac cath. Hydrated this am. 5. OSA 6. Morbid obesity.  Active Problems:   Acute coronary syndrome   Chest pain    Signed, Joei Frangos [place] MD,FACC 09/04/2013 9:57 AM

## 2013-09-04 NOTE — Progress Notes (Signed)
TR BAND REMOVAL  LOCATION:  right radial  DEFLATED PER PROTOCOL:  yes  TIME BAND OFF / DRESSING APPLIED:   1500   SITE UPON ARRIVAL:   Level 0  SITE AFTER BAND REMOVAL:  Level 0  REVERSE ALLEN'S TEST:    positive  CIRCULATION SENSATION AND MOVEMENT:  Within Normal Limits  yes  COMMENTS:    
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
## 2013-09-04 NOTE — Progress Notes (Signed)
[facility] for heparin gtt Indication: chest pain/ACS  No Known Allergies  Patient Measurements: Height: 6' (182.9 cm) Weight: 328 lb 11.3 oz (149.1 kg) IBW/kg (Calculated) : 77.6 Heparin Dosing Weight: 99.1 kg  Vital Signs: Temp: 98.4 F (36.9 C) (01/01 2111) Temp src: Oral (01/01 2111) BP: 155/97 mmHg (01/01 2111) Pulse Rate: 96 (01/01 2111)  Labs:  Recent Labs  09/03/13 1112 09/03/13 1651 09/03/13 1932 09/03/13 2133  HGB 16.5  --   --   --   HCT 47.5  --   --   --   PLT 223  --   --   --   APTT  --  75*  --   --   LABPROT  --  13.4  --   --   INR  --  1.04  --   --   HEPARINUNFRC  --   --   --  0.11*  CREATININE 1.46*  --   --   --   TROPONINI  --  <0.30 <0.30  --     Estimated Creatinine Clearance: 95 ml/min (by C-G formula based on Cr of 1.46).  Assessment: 48 y.o. male with chest pain for heparin   Goal of Therapy:  Heparin level 0.3-0.7 units/ml Monitor platelets by anticoagulation protocol: Yes   Plan:  Heparin 3000 units IV bolus, then increase heparin 1650 units/hr Follow-up am labs.  Phillis Knack, PharmD, BCPS  -09/04/2013 12:16 AM

## 2013-09-04 NOTE — Interval H&P Note (Signed)
History and Physical Interval Note:  09/04/2013 12:09 PM  Ronald Mcdaniel  has presented today for surgery, with the diagnosis of cp  The various methods of treatment have been discussed with the patient and family. After consideration of risks, benefits and other options for treatment, the patient has consented to  Procedure(s): LEFT HEART CATHETERIZATION WITH CORONARY ANGIOGRAM (N/A) as a surgical intervention .  The patient's history has been reviewed, patient examined, no change in status, stable for surgery.  I have reviewed the patient's chart and labs.  Questions were answered to the patient's satisfaction.   Cath Lab Visit (complete for each Cath Lab visit)  Clinical Evaluation Leading to the Procedure:   ACS: yes  Non-ACS:    Anginal Classification: CCS IV  Anti-ischemic medical therapy: Maximal Therapy (2 or more classes of medications)  Non-Invasive Test Results: No non-invasive testing performed  Prior CABG: No previous CABG        Ronald Mcdaniel [facility] 09/04/2013 12:09 PM

## 2013-09-04 NOTE — Progress Notes (Signed)
  Echocardiogram 2D Echocardiogram has been performed.  Ronald Mcdaniel 09/04/2013, 6:06 PM

## 2013-09-04 NOTE — H&P (View-Only) (Signed)
TELEMETRY: Reviewed telemetry pt in NSR: Filed Vitals:   09/04/13 0310 09/04/13 0320 09/04/13 0355 09/04/13 0800  BP:  187/105 193/115   Pulse:  88 101   Temp:  98.3 F (36.8 C)  97.8 F (36.6 C)  TempSrc:  Oral  Oral  Resp:  16 18   Height:      Weight:      SpO2: 75% 97% 97%     Intake/Output Summary (Last 24 hours) at 09/04/13 0945 Last data filed at 09/04/13 0600  Gross per 24 hour  Intake  372.5 ml  Output   1450 ml  Net -1077.5 ml    SUBJECTIVE Patient denies any recurrent chest pain since IV Ntg started. Breathing is OK. Has chronic itching for which he takes atarax. History of poor glycemic and BP control.  LABS: Basic Metabolic Panel:  Recent Labs  09/03/13 1112 09/04/13 0422  NA 135* 142  K 3.8 3.4*  CL 91* 99  CO2 29 30  GLUCOSE 505* 289*  BUN 14 11  CREATININE 1.46* 1.24  CALCIUM 9.5 9.3   Liver Function Tests:  Recent Labs  09/03/13 1112  AST 30  ALT 44  ALKPHOS 99  BILITOT 0.5  PROT 8.1  ALBUMIN 3.9    Recent Labs  09/03/13 1112  LIPASE 56   CBC:  Recent Labs  09/03/13 1112 09/04/13 0422  WBC 6.1 6.9  NEUTROABS 3.2  --   HGB 16.5 16.0  HCT 47.5 46.8  MCV 83.6 84.2  PLT 223 193   Cardiac Enzymes:  Recent Labs  09/03/13 1932 09/04/13 0130 09/04/13 0422  TROPONINI <0.30 <0.30 <0.30   Hemoglobin A1C:  Recent Labs  09/03/13 1932  HGBA1C 15.3*   Fasting Lipid Panel:  Recent Labs  09/04/13 0422  CHOL 197  HDL 28*  LDLCALC 126*  TRIG 216*  CHOLHDL 7.0    Radiology/Studies:  Dg Chest 2 View  09/03/2013   CLINICAL DATA:  Nausea vomiting  EXAM: CHEST  2 VIEW  COMPARISON:  08/09/2013  FINDINGS: The heart size and mediastinal contours are within normal limits. Both lungs are clear. The visualized skeletal structures are unremarkable.  IMPRESSION: No active cardiopulmonary disease.   Electronically Signed   By: Kathreen Devoid   On: 09/03/2013 12:30   Dg Chest 2 View  08/09/2013   CLINICAL DATA:  Hyperglycemia,  cough, congestion, fever  EXAM: CHEST  2 VIEW  COMPARISON:  08/11/2008  FINDINGS: Study is markedly limited by poor inspiration. Cardiomegaly is noted. Chronic elevation of the right hemidiaphragm. No acute infiltrate or pulmonary edema. Bilateral basilar atelectasis.  IMPRESSION: Limited study by poor inspiration. Cardiomegaly. Chronic elevation of the right hemidiaphragm. Bilateral basilar atelectasis. No acute infiltrate or pulmonary edema.   Electronically Signed   By: Lahoma Crocker M.D.   On: 08/09/2013 09:16   Ecg: NSR, poor R wave progression across the anterior precordium. Lateral infarct changes resolved.   Echo: pending.  PHYSICAL EXAM General: Morbidly obese, well nourished, in no acute distress. Head: Normocephalic, atraumatic, sclera non-icteric, no xanthomas, nares are without discharge. Neck: Negative for carotid bruits. JVD not elevated. Lungs: Clear bilaterally to auscultation without wheezes, rales, or rhonchi. Breathing is unlabored. Heart: RRR S1 S2 without murmurs, rubs, or gallops.  Abdomen: Soft, obese,non-tender, non-distended with normoactive bowel sounds. No hepatomegaly. Msk:  Strength and tone appears normal for age. Extremities: No clubbing, cyanosis or edema.  Distal pedal pulses are 2+ and equal bilaterally. Neuro: Alert and oriented X  3. Moves all extremities spontaneously. Psych:  Responds to questions appropriately with a normal affect.  ASSESSMENT AND PLAN: 1. Unstable angina. Patient with multiple cardiac risk factors presents with classic anginal symptoms at rest relieved with Ntg. Dynamic Ecg changes in lateral leads- now improved. POC troponin 0.09. All other troponins negative. Patient has a high TIMI score. Agree with recommendations for invasive evaluation with cardiac cath +/- PCI. Patient is very anxious about procedure since his mother died following cardiac cath in 2006. I discussed alternative evaluation including cardiac CTA or stress testing. We  discussed benefits and risks associated with each procedure. After thorough discussion he is agreeable to proceed with cardiac cath. The procedure and risks were reviewed including but not limited to death, myocardial infarction, stroke, arrythmias, bleeding, transfusion, emergency surgery, dye allergy, or renal dysfunction. The patient voices understanding and is agreeable to proceed. 2. HTN labile- continue Norvasc, Catapres patch, cozaar. Switch metoprolol to carvedilol 25 mg bid.  3. DM type 2 with poor control. A1c 15.3. On SSI. Will start back on Glargine insulin 60 units bid. 4. CKD stage 2. Creatinine improved this am. Will try and limit dye load with cardiac cath. Hydrated this am. 5. OSA 6. Morbid obesity.  Active Problems:   Acute coronary syndrome   Chest pain    Signed, Kamsiyochukwu Buist [place] MD,FACC 09/04/2013 9:57 AM

## 2013-09-04 NOTE — Progress Notes (Signed)
Late entry: Pt headed to cath lab around 1140. VSS, d/c hep gtt on call. No admin orders at the time. Handed off to cath lab RN.

## 2013-09-04 NOTE — CV Procedure (Addendum)
    Cardiac Catheterization Procedure Note  Name: Ronald Mcdaniel MRN: [id number] DOB: [birth year]  Procedure: Left Heart Cath, Selective Coronary Angiography  Indication: 48 yo BM with history of poorly controlled DM, HTN, dyslipidemia presents with chest pain at rest associated with lateral ST changes.    Procedural Details: The right wrist was prepped, draped, and anesthetized with 1% lidocaine. Using the modified Seldinger technique, a 5 French sheath was introduced into the right radial artery. 3 mg of verapamil was administered through the sheath, weight-based unfractionated heparin was administered intravenously. Standard Judkins catheters were used for selective coronary angiography. Catheter exchanges were performed over an exchange length guidewire. There were no immediate procedural complications. A TR band was used for radial hemostasis at the completion of the procedure.  The patient was transferred to the post catheterization recovery area for further monitoring. 40 cc of contrast used.  Procedural Findings: Hemodynamics: AO 107/65 mean 77 mm Hg LV 107/13 mm Hg  Coronary angiography: Coronary dominance: right  Left mainstem: Normal  Left anterior descending (LAD): mild irregularities in the proximal and mid vessel up to 20%.  Left circumflex (LCx): large vessel giving rise to 3 large OM branches. Normal.  Right coronary artery (RCA): mild irregularities less than 10%.  Left ventriculography: Not done.  Final Conclusions:   1. Minimal nonobstructive CAD   Recommendations: Aggressive risk factor modification. Check LV function by Echo.  Collier [facility] 09/04/2013, 12:32 PM

## 2013-09-05 ENCOUNTER — Encounter (HOSPITAL_COMMUNITY): Payer: Self-pay | Admitting: Physician Assistant

## 2013-09-05 DIAGNOSIS — I119 Hypertensive heart disease without heart failure: Secondary | ICD-10-CM | POA: Diagnosis present

## 2013-09-05 DIAGNOSIS — N182 Chronic kidney disease, stage 2 (mild): Secondary | ICD-10-CM | POA: Diagnosis present

## 2013-09-05 DIAGNOSIS — R079 Chest pain, unspecified: Secondary | ICD-10-CM

## 2013-09-05 LAB — BASIC METABOLIC PANEL
BUN: 11 mg/dL (ref 6–23)
CO2: 25 meq/L (ref 19–32)
CREATININE: 1.27 mg/dL (ref 0.50–1.35)
Calcium: 9.5 mg/dL (ref 8.4–10.5)
Chloride: 103 mEq/L (ref 96–112)
GFR calc Af Amer: 77 mL/min — ABNORMAL LOW (ref >90)
GFR calc non Af Amer: 66 mL/min — ABNORMAL LOW (ref >90)
Glucose, Bld: 295 mg/dL — ABNORMAL HIGH (ref 70–99)
Potassium: 4.7 mEq/L (ref 3.7–5.3)
Sodium: 141 mEq/L (ref 137–147)

## 2013-09-05 LAB — CBC
HCT: 46.6 % (ref 39.0–52.0)
Hemoglobin: 15.6 g/dL (ref 13.0–17.0)
MCH: 27.8 pg (ref 26.0–34.0)
MCHC: 33.5 g/dL (ref 30.0–36.0)
MCV: 82.9 fL (ref 78.0–100.0)
Platelets: 174 10*3/uL (ref 150–400)
RBC: 5.62 MIL/uL (ref 4.22–5.81)
RDW: 15.2 % (ref 11.5–15.5)
WBC: 5.2 10*3/uL (ref 4.0–10.5)

## 2013-09-05 LAB — GLUCOSE, CAPILLARY: Glucose-Capillary: 246 mg/dL — ABNORMAL HIGH (ref 70–99)

## 2013-09-05 MED ORDER — ATORVASTATIN CALCIUM 40 MG PO TABS: 40.0000 mg | ORAL_TABLET | Freq: Every evening | ORAL | Status: AC

## 2013-09-05 MED ORDER — ASPIRIN 81 MG PO TBEC: 81.0000 mg | DELAYED_RELEASE_TABLET | Freq: Every day | ORAL | Status: AC

## 2013-09-05 MED ORDER — CARVEDILOL 25 MG PO TABS: 25.0000 mg | ORAL_TABLET | Freq: Two times a day (BID) | ORAL | Status: AC

## 2013-09-05 MED ORDER — LOSARTAN POTASSIUM 50 MG PO TABS
50.0000 mg | ORAL_TABLET | Freq: Once | ORAL | Status: AC
  Administered 2013-09-05: 50 mg via ORAL

## 2013-09-05 MED ORDER — LOSARTAN POTASSIUM 100 MG PO TABS: 100.0000 mg | ORAL_TABLET | Freq: Every day | ORAL | Status: AC

## 2013-09-05 NOTE — Progress Notes (Signed)
Pt. Has already taken CPAP off at this time. Pt. Is anxious, RN is aware & is going to give pt. Meds.

## 2013-09-05 NOTE — Discharge Summary (Signed)
See also my rounding note.
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
## 2013-09-05 NOTE — Progress Notes (Signed)
Pt monitor continues to ring off VT, patient symptomatic each time, new patches applied. Will continue to monitor.

## 2013-09-05 NOTE — ED Provider Notes (Signed)
Medical screening examination/treatment/procedure(s) were conducted as a shared visit with non-physician practitioner(s) and myself.  I personally evaluated the patient during the encounter.  EKG Interpretation    Date/Time:  Thursday September 03 2013 12:52:11 EST Ventricular Rate:  97 PR Interval:  169 QRS Duration: 94 QT Interval:  412 QTC Calculation: 523 R Axis:   124 Text Interpretation:  Sinus rhythm Probable left atrial enlargement Right axis deviation Borderline repolarization abnormality nonspecific st/t wave changes Prolonged QT interval Since previous tracing st/t wave changes are new Confirmed by Canary Brim  MD, Orean Giarratano [phone]) on 09/05/2013 5:27:28 PM           Pt seen and evaluated.  Pt mentioned chest pain as part of his workup, was not his chief complaint.  EKG with some st/t wave changes compared to prior, initial troponin negative, given his comorbidities, 3 hour troponin checked and this was elevated.  Pt denies current chest pain during my evaluation, he is talkative, alert, no increased work of breathing, CV- RRR, no murmur.  D/w cardiology and heparin drip ordered.  D/w cardiology, pt admitted for further evaluation and treatment.  CRITICAL CARE Performed by: Threasa Beards Total critical care time: 35 Critical care time was exclusive of separately billable procedures and treating other patients. Critical care was necessary to treat or prevent imminent or life-threatening deterioration. Critical care was time spent personally by me on the following activities: development of treatment plan with patient and/or surrogate as well as nursing, discussions with consultants, evaluation of patient's response to treatment, examination of patient, obtaining history from patient or surrogate, ordering and performing treatments and interventions, ordering and review of laboratory studies, ordering and review of radiographic studies, pulse oximetry and re-evaluation of patient's  condition.  Threasa Beards, MD 09/05/13 [phone]

## 2013-09-05 NOTE — Progress Notes (Signed)
Pt monitor alarmed Vfib/vtach.  Went to check on patient, he said he feels fine, BP 154/104, HR 84, on call MD has already been contacted about high BP.  Will continue to monitor.

## 2013-09-05 NOTE — Progress Notes (Signed)
Primary care: [facility]  Subjective:    No active chest pain or shortness of breath. No palpitations or dizziness. No problems with catheterization access site. Eager to go home.  Objective:   Temp:  [97.6 F (36.4 C)-98.5 F (36.9 C)] 97.9 F (36.6 C) (01/03 0533) Pulse Rate:  [81-96] 83 (01/03 0533) Resp:  [17-21] 20 (01/03 0533) BP: (110-169)/(72-110) 158/88 mmHg (01/03 0533) SpO2:  [84 %-99 %] 99 % (01/03 0533) Weight:  [324 lb 8.3 oz (147.2 kg)] 324 lb 8.3 oz (147.2 kg) (01/03 0036) Last BM Date: 09/04/13  [facility]   09/03/13 1058 09/03/13 2111 09/05/13 0036  Weight: 230 lb (104.327 kg) 328 lb 11.3 oz (149.1 kg) 324 lb 8.3 oz (147.2 kg)    Intake/Output Summary (Last 24 hours) at 09/05/13 0729 Last data filed at 09/04/13 2110  Gross per 24 hour  Intake 2220.69 ml  Output   1925 ml  Net 295.69 ml    Telemetry: Sinus rhythm with motion artifact. No evidence of VT.  Exam:  General: No distress.  Lungs: Clear, nonlabored.  Cardiac: RRR, no gallop.  Extremities: Stable right renal catheterization site.  Lab Results:  Basic Metabolic Panel:  Recent Labs Lab 09/03/13 1112 09/04/13 0422 09/05/13 0402  NA 135* 142 141  K 3.8 3.4* 4.7  CL 91* 99 103  CO2 29 30 25   GLUCOSE 505* 289* 295*  BUN 14 11 11   CREATININE 1.46* 1.24 1.27  CALCIUM 9.5 9.3 9.5    Liver Function Tests:  Recent Labs Lab 08/30/13 1235 09/03/13 1112  AST 21 30  ALT 25 44  ALKPHOS 103 99  BILITOT 0.5 0.5  PROT 8.8* 8.1  ALBUMIN 4.2 3.9    CBC:  Recent Labs Lab 09/03/13 1112 09/04/13 0422 09/05/13 0402  WBC 6.1 6.9 5.2  HGB 16.5 16.0 15.6  HCT 47.5 46.8 46.6  MCV 83.6 84.2 82.9  PLT 223 193 174    Cardiac Enzymes:  Recent Labs Lab 09/03/13 1932 09/04/13 0130 09/04/13 0422  TROPONINI <0.30 <0.30 <0.30    Echocardiogram: Study Conclusions  - Left ventricle: The cavity size was normal. There was severe concentric  hypertrophy. Systolic function was normal. The estimated ejection fraction was in the range of 60% to 65%. Wall motion was normal; there were no regional wall motion abnormalities. Doppler parameters are consistent with abnormal left ventricular relaxation (grade 1 diastolic dysfunction). - Aortic valve: Trivial regurgitation. - Left atrium: The atrium was moderately dilated.    Medications:   Scheduled Medications: . amLODipine  10 mg Oral Daily  . aspirin EC  81 mg Oral Daily  . atorvastatin  80 mg Oral Daily  . carvedilol  25 mg Oral BID WC  . gabapentin  400 mg Oral BID  . heparin  5,000 Units Subcutaneous Q8H  . hydrochlorothiazide  25 mg Oral Daily  . insulin aspart  0-15 Units Subcutaneous TID WC  . insulin glargine  60 Units Subcutaneous BID  . losartan  50 mg Oral Daily  . multivitamin  1 tablet Oral Daily  . pantoprazole  40 mg Oral Daily      PRN Medications:  acetaminophen, hydrOXYzine, nitroGLYCERIN, ondansetron (ZOFRAN) IV, zolpidem   Assessment:   1. Presentation with chest pain and abnormal ECG concerning for unstable angina, however cardiac markers ultimately were normal, and cardiac catheterization was reassuring.  2. Cardiac catheterization by Dr. Martinique in January 2 revealed minimal coronary atherosclerosis. Echocardiogram shows normal LVEF  with grade 1 diastolic dysfunction and moderate left atrial enlargement.  3. Type 2 diabetes mellitus, poorly controlled.  4. Hypertension, blood pressure recently elevated.  5. Obstructive sleep apnea on CPAP.  6. CKD, stage 2 to 3. Current creatinine 1.2.  7. Known hypertriglyceridemia. Recent lipid panel with triglycerides 216, LDL 126, HDL 28. Now on Lipitor.   Plan/Discussion:    Patient will be discharged home today. Medications have been modified significantly, losartan is also being increased to 100 mg daily for better blood pressure control. He will need to have a close followup visit with his  primary care provider in a week, will need a BMET at that time to followup on renal function and potassium. No further cardiac workup anticipated at this time given reassuring evaluation.   Satira Sark, M.D., F.A.C.C.

## 2013-09-05 NOTE — Progress Notes (Signed)
Pt. Was placed on CPAP auto titrate (pt is unaware of home settings) via FFM (what pt. Wears at home) Pt. Is tolerating CPAP well at this time without any complications.

## 2013-09-16 ENCOUNTER — Telehealth: Payer: Self-pay

## 2013-09-16 NOTE — Telephone Encounter (Signed)
Called and spoke w/wife (on [employer]IP[employer]) d/t pt not coming in for f/up BMET and BP check as instr'd. Wife reported that pt is in [place] and will not return until the end of the month. He will be relocating there. She did not have a number with her where he is staying but stated she will remind pt of what f/up is needed this week and have him find a provider down there to check him. Carlis Stable

## 2013-09-16 NOTE — Telephone Encounter (Signed)
Message copied by Dallas Schimke on Wed Sep 16, 2013  9:52 AM ------      Message from: Charlie Pitter      Created: Sat Sep 05, 2013  7:56 AM      Regarding: Hospital Discharge       Hi Sarah!      Hope you are doing well. I am discharging Ronald Mcdaniel from the hospital Saturday 09/05/13 s/p cath which only showed minimal nonobstructive CAD. Dr. Domenic Polite  (one of my docs) does not feel he needs cardiac followup, but wants him to follow up with his PCP for:      - BMET 1 week with appointment for BP followup       -lipids/LFTs in about 6 weeks            Ronald Mcdaniel identified you as his primary. Can you help arrange? I also cc'd to Elwyn Reach because I saw her name in the triage phone notes for your office just in case.            Appreciate your help!      Dayna       ------

## 2014-08-12 ENCOUNTER — Encounter (HOSPITAL_COMMUNITY): Payer: Self-pay | Admitting: Cardiology

## 2015-05-16 IMAGING — CR DG CHEST 2V
2 series · 2 of 2 positions shown · non-contrast
Comparison: 08/11/2008

CLINICAL DATA: Hyperglycemia, cough, congestion, fever

EXAM:
CHEST  2 VIEW

[w chest lat]
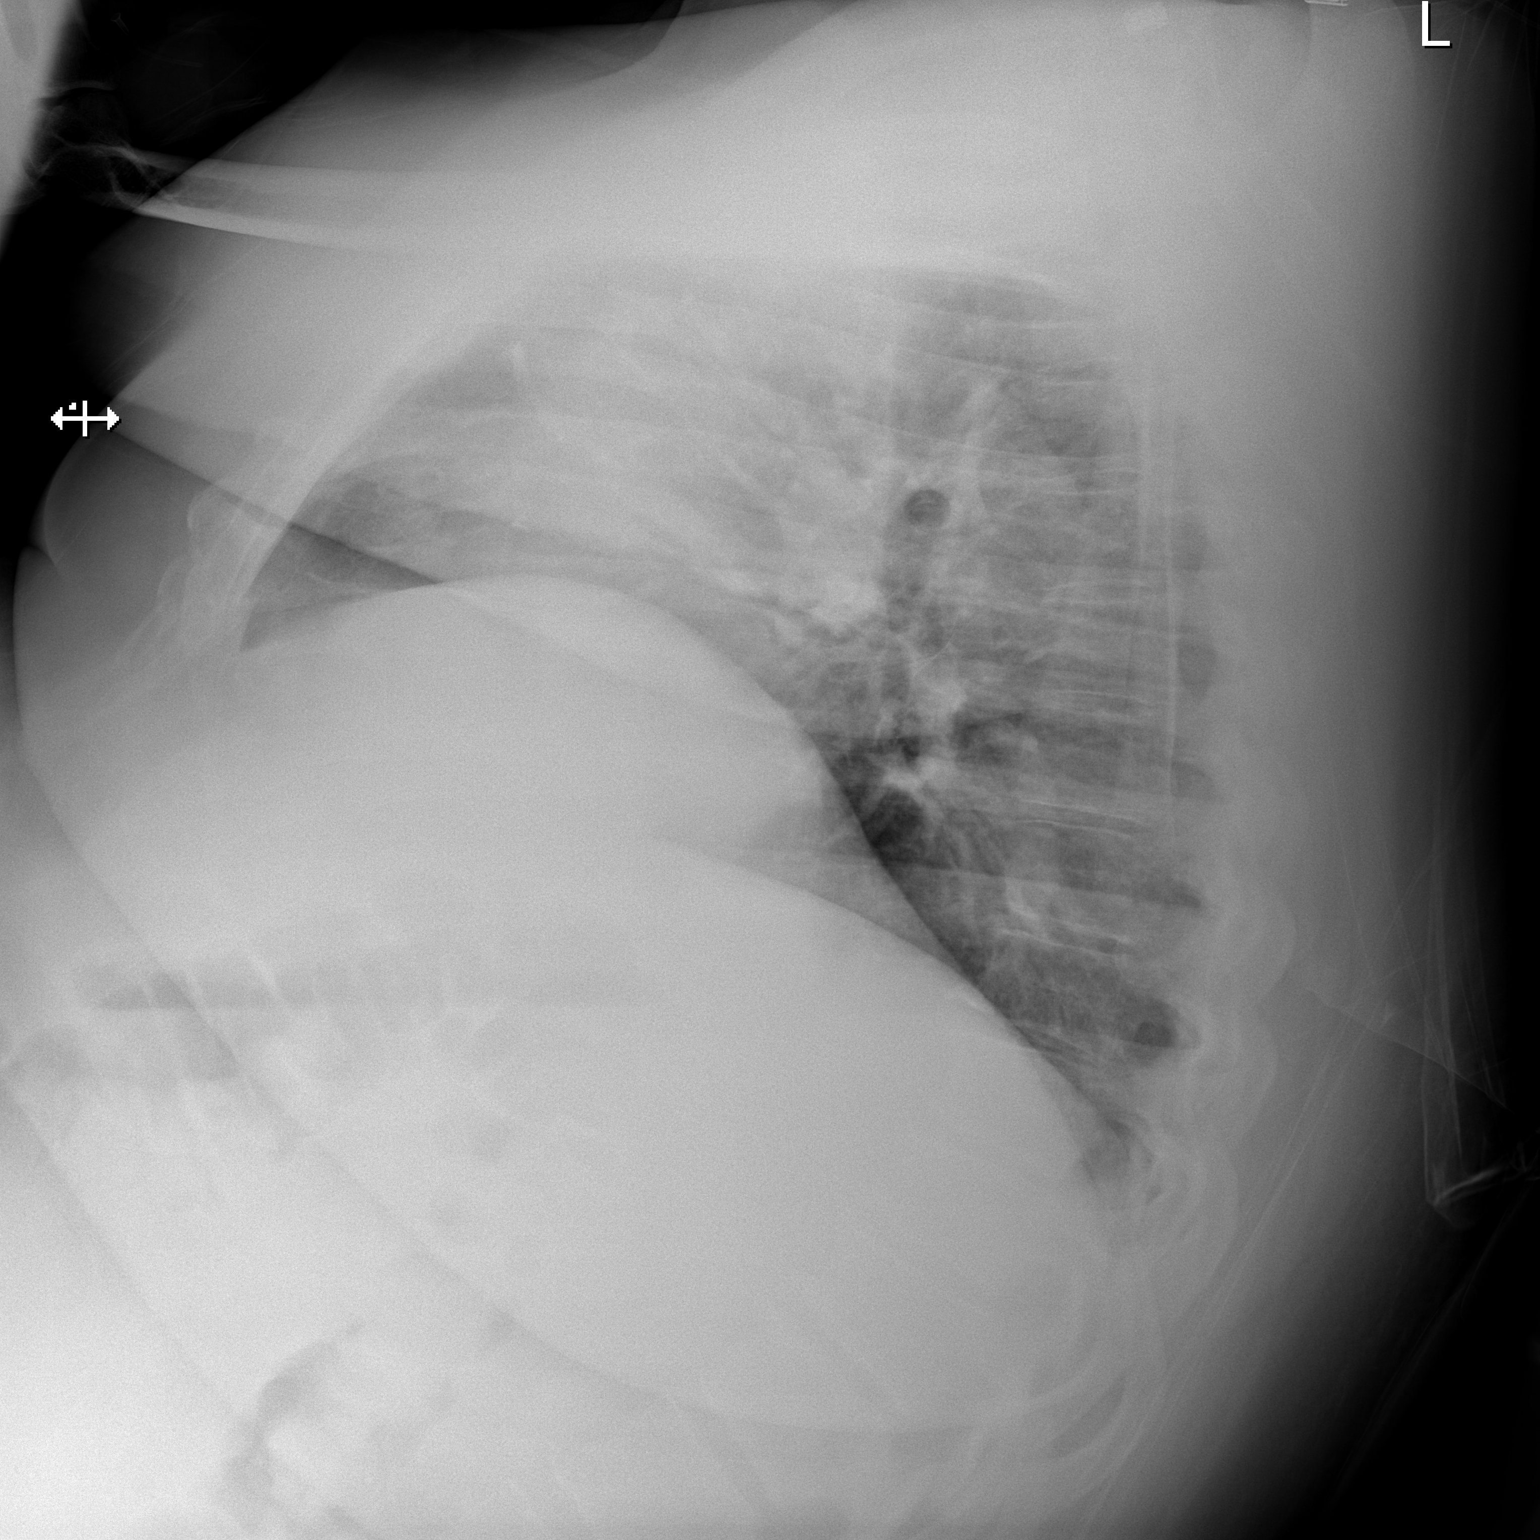

[x chest ap]
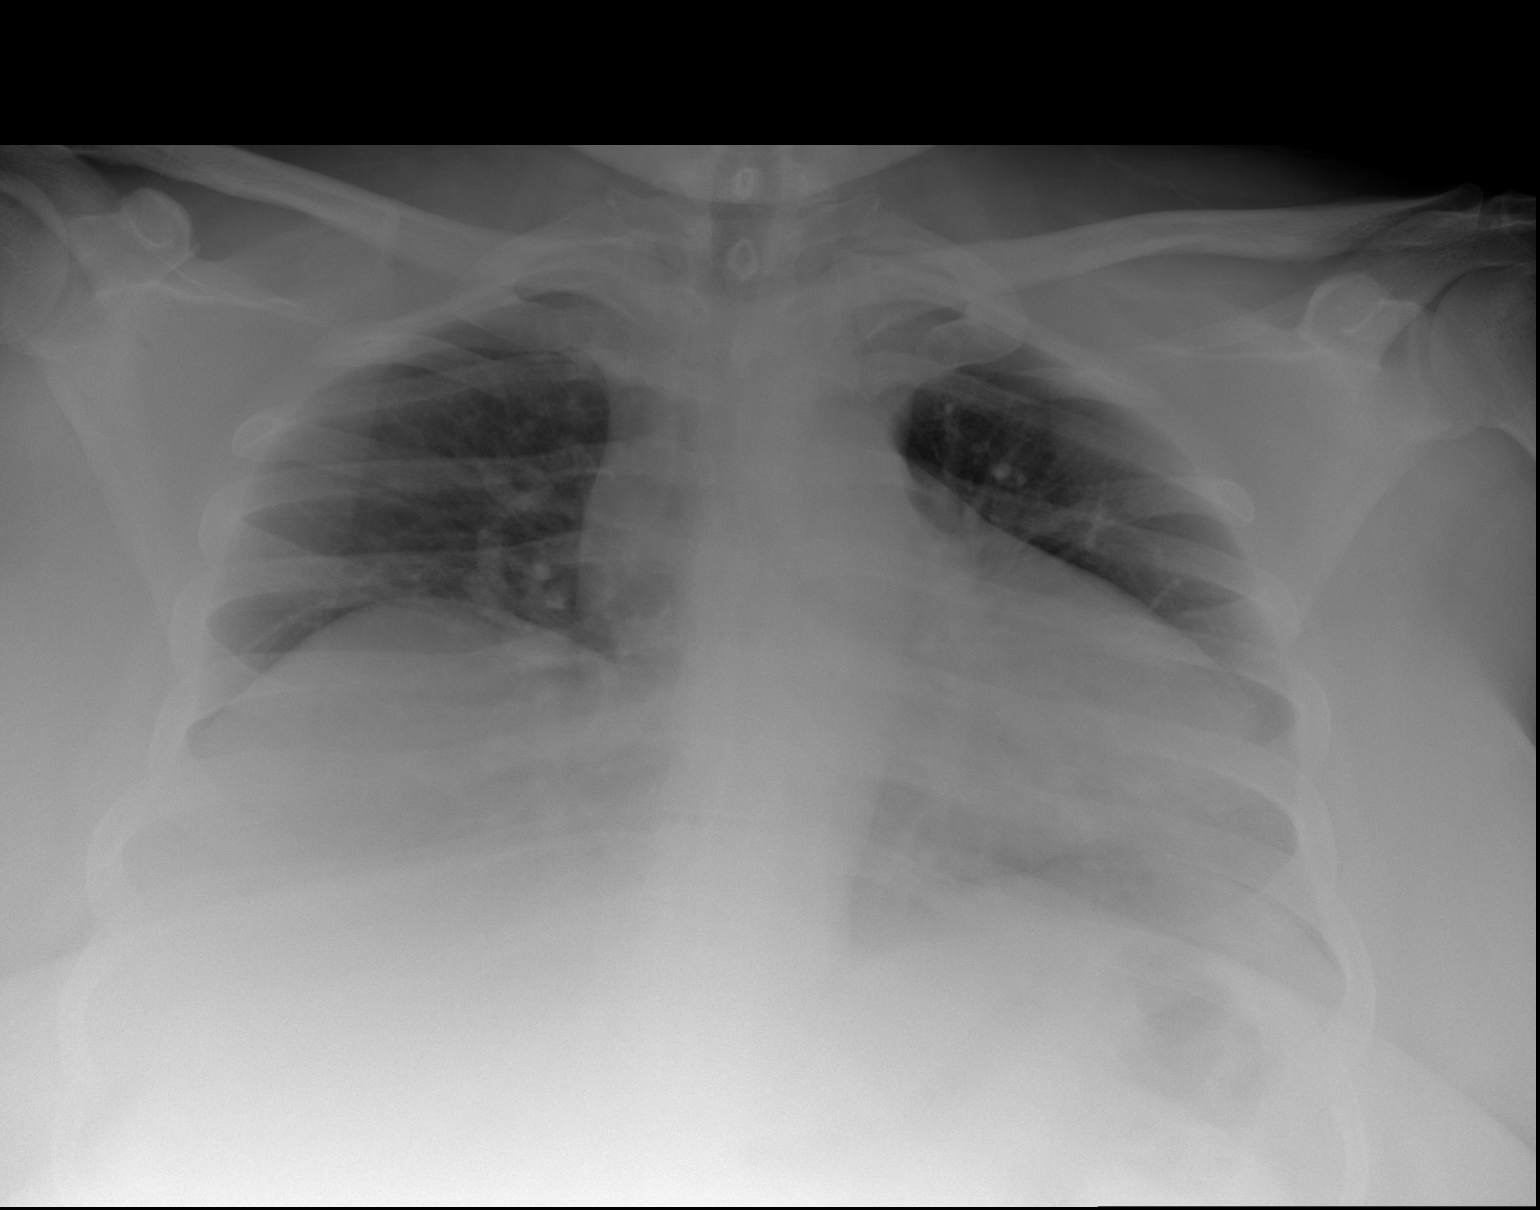

[2 of 2 positions shown; findings below may reference images not displayed]

FINDINGS: Study is markedly limited by poor inspiration. Cardiomegaly is
noted. Chronic elevation of the right hemidiaphragm. No acute
infiltrate or pulmonary edema. Bilateral basilar atelectasis.
IMPRESSION: Limited study by poor inspiration. Cardiomegaly. Chronic elevation
of the right hemidiaphragm. Bilateral basilar atelectasis. No acute
infiltrate or pulmonary edema.
